# Patient Record
Sex: Male | Born: 1985 | Race: White | Hispanic: No | Marital: Single | Smoking: Current every day smoker
Health system: Southern US, Community
[De-identification: ages and names within clinical notes are randomized; demographics above are authoritative.]

## PROBLEM LIST (undated history)

## (undated) DIAGNOSIS — F419 Anxiety disorder, unspecified: Secondary | ICD-10-CM

## (undated) DIAGNOSIS — N433 Hydrocele, unspecified: Secondary | ICD-10-CM

## (undated) DIAGNOSIS — Z87828 Personal history of other (healed) physical injury and trauma: Secondary | ICD-10-CM

## (undated) DIAGNOSIS — F909 Attention-deficit hyperactivity disorder, unspecified type: Secondary | ICD-10-CM

## (undated) HISTORY — PX: OTHER SURGICAL HISTORY: SHX169

---
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
## 2008-08-13 ENCOUNTER — Inpatient Hospital Stay (HOSPITAL_COMMUNITY): Admission: EM | Admit: 2008-08-13 | Discharge: 2008-08-14 | Payer: Self-pay | Admitting: Emergency Medicine

## 2008-08-13 HISTORY — PX: OTHER SURGICAL HISTORY: SHX169

## 2009-08-17 ENCOUNTER — Emergency Department (HOSPITAL_COMMUNITY): Admission: EM | Admit: 2009-08-17 | Discharge: 2009-08-17 | Payer: Self-pay | Admitting: Emergency Medicine

## 2010-04-12 LAB — URINALYSIS, ROUTINE W REFLEX MICROSCOPIC
Bilirubin Urine: NEGATIVE
Ketones, ur: NEGATIVE mg/dL
Protein, ur: NEGATIVE mg/dL
Specific Gravity, Urine: 1.026 (ref 1.005–1.030)
pH: 7 (ref 5.0–8.0)

## 2010-04-12 LAB — BASIC METABOLIC PANEL
BUN: 9 mg/dL (ref 6–23)
CO2: 25 mEq/L (ref 19–32)
CO2: 25 mEq/L (ref 19–32)
Calcium: 9 mg/dL (ref 8.4–10.5)
Chloride: 106 mEq/L (ref 96–112)
GFR calc Af Amer: >60 mL/min (ref >60)
GFR calc non Af Amer: >60 mL/min (ref >60)
Potassium: 3.6 mEq/L (ref 3.5–5.1)
Sodium: 138 mEq/L (ref 135–145)

## 2010-04-12 LAB — CBC
HCT: 42 % (ref 39.0–52.0)
Hemoglobin: 13.9 g/dL (ref 13.0–17.0)
MCHC: 34.3 g/dL (ref 30.0–36.0)
MCV: 89.9 fL (ref 78.0–100.0)
MCV: 90.2 fL (ref 78.0–100.0)
Platelets: 226 10*3/uL (ref 150–400)
RBC: 4.49 MIL/uL (ref 4.22–5.81)
RDW: 12.6 % (ref 11.5–15.5)
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
## 2011-09-05 ENCOUNTER — Emergency Department (HOSPITAL_BASED_OUTPATIENT_CLINIC_OR_DEPARTMENT_OTHER)
Admission: EM | Admit: 2011-09-05 | Discharge: 2011-09-05 | Disposition: A | Discharge status: Home / Self Care | Payer: Self-pay | Attending: Emergency Medicine | Admitting: Emergency Medicine

## 2011-09-05 ENCOUNTER — Emergency Department (HOSPITAL_BASED_OUTPATIENT_CLINIC_OR_DEPARTMENT_OTHER): Payer: Self-pay

## 2011-09-05 ENCOUNTER — Encounter (HOSPITAL_BASED_OUTPATIENT_CLINIC_OR_DEPARTMENT_OTHER): Payer: Self-pay | Admitting: *Deleted

## 2011-09-05 DIAGNOSIS — Y9317 Activity, water skiing and wake boarding: Secondary | ICD-10-CM | POA: Insufficient documentation

## 2011-09-05 DIAGNOSIS — F17200 Nicotine dependence, unspecified, uncomplicated: Secondary | ICD-10-CM | POA: Insufficient documentation

## 2011-09-05 DIAGNOSIS — S0292XA Unspecified fracture of facial bones, initial encounter for closed fracture: Secondary | ICD-10-CM

## 2011-09-05 DIAGNOSIS — S02109A Fracture of base of skull, unspecified side, initial encounter for closed fracture: Secondary | ICD-10-CM | POA: Insufficient documentation

## 2011-09-05 DIAGNOSIS — Y998 Other external cause status: Secondary | ICD-10-CM | POA: Insufficient documentation

## 2011-09-05 DIAGNOSIS — IMO0002 Reserved for concepts with insufficient information to code with codable children: Secondary | ICD-10-CM | POA: Insufficient documentation

## 2011-09-05 MED ORDER — HYDROCODONE-ACETAMINOPHEN 5-325 MG PO TABS: 2.0000 | ORAL_TABLET | ORAL | Status: AC | PRN
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
## 2011-09-06 NOTE — ED Provider Notes (Signed)
Medical screening examination/treatment/procedure(s) were performed by non-physician practitioner and as supervising physician I was immediately available for consultation/collaboration.   Teaghan Formica, MD 09/06/11 0651 

## 2013-11-23 ENCOUNTER — Telehealth: Payer: Self-pay

## 2013-11-23 NOTE — Telephone Encounter (Signed)
[facility] with [facility] who indicated she received phone call from patient's mother indicating PCP appointment needed.  Per Scheduler, patient's mother concerned because patient told her one of his testicles is the "size of a pear."  Discussed with Dr. Hyman HopesJegede who indicated patient should go to the ED for an ultrasound.  Attempted to place call to patient's home number to discuss; however, patient's mother indicates he is unavailable. Patient also unavailable at his mobile number. Spoke with patient's mother, his emergency contact, to follow-up on her previous call to clinic. She indicated patient had a cyst in his testicle as a teenager, and he recently informed her that one of his testicles is much larger and is now the "size of a pear" and "painful sometimes."  Informed her he should go to the ED as soon as possible for evaluation of his testicle. Ultrasound may be needed.  Patient's mother (emergency contact) verbalized understanding and indicated she would get patient to the ED.  Also attempted once again to contact patient's cell [phone]) unable to reach patient. Left voicemail requesting return call.

## 2013-12-18 ENCOUNTER — Ambulatory Visit: Payer: Self-pay

## 2014-02-16 ENCOUNTER — Emergency Department (INDEPENDENT_AMBULATORY_CARE_PROVIDER_SITE_OTHER)
Admission: EM | Admit: 2014-02-16 | Discharge: 2014-02-16 | Disposition: A | Discharge status: Home / Self Care | Payer: Self-pay | Source: Home / Self Care | Attending: Family Medicine | Admitting: Family Medicine

## 2014-02-16 ENCOUNTER — Encounter (HOSPITAL_COMMUNITY): Payer: Self-pay | Admitting: Emergency Medicine

## 2014-02-16 DIAGNOSIS — N5089 Other specified disorders of the male genital organs: Secondary | ICD-10-CM

## 2014-02-16 DIAGNOSIS — N508 Other specified disorders of male genital organs: Secondary | ICD-10-CM

## 2014-02-16 NOTE — Discharge Instructions (Signed)
Scrotal Masses Scrotal swelling is common in men of all ages. Common types of testicular masses include:   Hydrocele. The most common benign testicular mass in an adult. Hydroceles are generally soft and painless collections of fluid in the scrotal sac. These can rapidly change size as the fluid enters or leaves. Hydroceles can be associated with an underlying cancer of the testicle.  Spermatoceles. Generally soft and painless cyst-like masses in the scrotum that contain fluid, usually above the testicle. They can rapidly change size as the fluid enters or leaves. They are more prominent while standing or exercising. Sometimes, spermatoceles may cause a sensation of heaviness or a dull ache.  Orchitis. Inflammation of the testicle. It is painful and may be associated with a fever or symptoms of a urinary tract infection, including frequent and painful urination. It is common in males who have the mumps.  Varicocele. An enlargement of the veins that drain the testicles. Varicoceles usually occur on the left side of the scrotum. This condition can increase the risk of infertility. Varicocele is sometimes more prominent while standing or exercising. Sometimes, varicoceles may cause a sensation of heaviness or a dull ache.  Inguinal hernia. A bulge caused by a portion of intestine protruding into the scrotum through a weak area in the abdominal muscles. Hernias may or may not be painful. They are soft and usually enlarge with coughing or straining.  Torsion of the testis. This can cause a testicular mass that develops quickly and is associated with tenderness or fever, or both. It is caused by a twisting of the testicle within the sac. It also reduces the blood supply and can destroy the testis if not treated quickly with surgery.  Epididymitis. Inflammation of the epididymis (a structure attached above and behind the testicle), usually caused by a urinary tract infection or a sexually transmitted  infection. This generally shows up as testicular discomfort and swelling and may include pain during urination. It is frequently associated with a testicle infection.  Testicular appendages. Remnants of tissue on the testis present since birth. A testicular appendage can twist on its blood supply and cause pain. In most cases, this is seen as a blue dot on the scrotum.  Hematocele. A collection of blood between the layers of the sac inside the scrotum. It usually is caused by trauma to the scrotum.  Sebaceous cysts. These can be a swelling in the skin of the scrotum and are usually painless.  Cancer (carcinoma) of the skin of the scrotum. It can cause scrotal swelling, but this is rare. Document Released: 06/27/2002 Document Revised: 08/23/2012 Document Reviewed: 06/12/2012 [facility] Patient Information 2015 [employer], [place]. This information is not intended to replace advice given to you by your health care provider. Make sure you discuss any questions you have with your health care provider.  Scrotal Swelling Scrotal swelling may occur on one or both sides of the scrotum. Pain may also occur with swelling. Possible causes of scrotal swelling include:   Injury.  Infection.  An ingrown hair or abrasion in the area.  Repeated rubbing from tight-fitting underwear.  Poor hygiene.  A weakened area in the muscles around the groin (hernia). A hernia can allow abdominal contents to push into the scrotum.  Fluid around the testicle (hydrocele).  Enlarged vein around the testicle (varicocele).  Certain medical treatments or existing conditions.  A recent genital surgery or procedure.  The spermatic cord becomes twisted in the scrotum, which cuts off blood supply (testicular torsion).  Testicular  cancer. HOME CARE INSTRUCTIONS Once the cause of your scrotal swelling has been determined, you may be asked to monitor your scrotum for any changes. The following actions may help to alleviate  any discomfort you are experiencing:  Rest and limit activity until the swelling goes away. Lying down is the preferred position.  Put ice on the scrotum:  Put ice in a plastic bag.  Place a towel between your skin and the bag.  Leave the ice on for 20 minutes, 2-3 times a day for 1-2 days.  Place a rolled towel under the testicles for support.  Wear loose-fitting clothing or an athletic support cup for comfort.  Take all medicines as directed by your health care provider.  Perform a monthly self-exam of the scrotum and penis. Feel for changes. Ask your health care provider how to perform a monthly self-exam if you are unsure. SEEK MEDICAL CARE IF:  You have a sudden (acute) onset of pain that is persistent and not improving.  You notice a heavy feeling or fluid in the scrotum.  You have pain or burning while urinating.  You have blood in the urine or semen.  You feel a lump around the testicle.  You notice that one testicle is larger than the other (slight variation is normal).  You have a persistent dull ache or pain in the groin or scrotum. SEEK IMMEDIATE MEDICAL CARE IF:  The pain does not go away or becomes severe.  You have a fever or shaking chills.  You have pain or vomiting that cannot be controlled.  You notice significant redness or swelling of one or both sides of the scrotum.  You experience redness spreading upward from your scrotum to your abdomen or downward from your scrotum to your thighs. MAKE SURE YOU:  Understand these instructions.  Will watch your condition.  Will get help right away if you are not doing well or get worse. Document Released: 01/23/2010 Document Revised: 08/23/2012 Document Reviewed: 05/25/2012 [facility] Patient Information 2015 [place], [place]. This information is not intended to replace advice given to you by your health care provider. Make sure you discuss any questions you have with your health care provider.

## 2014-02-16 NOTE — ED Notes (Signed)
C/o right testicle swelling.  States a problem for a while but has gradually gotten bigger.  Denies pain or injury.

## 2014-02-16 NOTE — ED Provider Notes (Signed)
CSN: [id number]     Arrival date & time 02/16/14  1502 History   First MD Initiated Contact with Patient 02/16/14 1546     Chief Complaint  Patient presents with  . Groin Swelling   (Consider location/radiation/quality/duration/timing/severity/associated sxs/prior Treatment) HPI Comments: 29 year old male has had an enlarged right scrotum for 10 years state that it has been increasing in size in particular over the past 5 years. He has missed 3 appointments recently for evaluation stating that he was too scared to go to those appointments. He states that there is very little pain associated with this mass but does interfere with sports activities. There is been no change today or in the past month. No increased pain, discoloration or other abnormalities. Urine function is nl.   History reviewed. No pertinent past medical history. Past Surgical History  Procedure Laterality Date  . Hand surgery     History reviewed. No pertinent family history. History  Substance Use Topics  . Smoking status: Current Every Day Smoker  . Smokeless tobacco: Not on file  . Alcohol Use: No    Review of Systems  Constitutional: Positive for activity change. Negative for fever.  Genitourinary: Positive for scrotal swelling. Negative for urgency, discharge, penile swelling, difficulty urinating, penile pain and testicular pain.    Allergies  Review of patient's allergies indicates no known allergies.  Home Medications   Prior to Admission medications   Medication Sig Start Date End Date Taking? Authorizing Provider  ibuprofen (ADVIL,MOTRIN) 200 MG tablet Take 200 mg by mouth every 6 (six) hours as needed. For headache.    Historical Provider, MD  Pseudoephedrine-DM-GG (ROBITUSSIN COLD & COUGH PO) Take 10 mLs by mouth daily as needed.    Historical Provider, MD   BP 113/72 mmHg  Pulse 87  Temp(Src) 98.2 F (36.8 C) (Oral)  Resp 16  SpO2 98% Physical Exam  Constitutional: He is oriented to person,  place, and time. He appears well-developed and well-nourished. No distress.  Pulmonary/Chest: Effort normal. No respiratory distress.  Genitourinary:  Normal phallus. The right scrotum is severely swollen. The terminal/dependent area has a large spherical mass approximately the size of a softball. When manipulated it is only mildly tender. There is minor hyperemia to the dependent areas. No cellulitis is appreciated. Unable to palpate the right testicle due to the swelling. Left testicle is of normal size and configuration. There are no external lesions observed.  Neurological: He is alert and oriented to person, place, and time.  Skin: Skin is warm.  Psychiatric: He has a normal mood and affect.  Nursing note and vitals reviewed.   ED Course  Procedures (including critical care time) Labs Review Labs Reviewed - No data to display  Imaging Review No results found.   MDM   1. Scrotal mass    Call urologist Monday for appt.    Hayden Rasmussenavid Telitha Plath, NP 02/16/14 [phone]

## 2014-02-28 ENCOUNTER — Other Ambulatory Visit: Payer: Self-pay | Admitting: Urology

## 2014-03-01 ENCOUNTER — Encounter (HOSPITAL_BASED_OUTPATIENT_CLINIC_OR_DEPARTMENT_OTHER): Payer: Self-pay | Admitting: *Deleted

## 2014-03-04 NOTE — Progress Notes (Signed)
UNABLE TO REACH PT.  LM ON HIS CELL TO ARRIVE AT 0830. NEEDS HX DONE.  DID SPEAK TO MOTHER LAST WEEK , SINCE SHE STATES SHE WAS BRINGING PT DOS, GAVE HER ARRIVAL TIME AT 0830 AND LOCATION.

## 2014-03-05 ENCOUNTER — Ambulatory Visit (HOSPITAL_BASED_OUTPATIENT_CLINIC_OR_DEPARTMENT_OTHER): Payer: Self-pay | Admitting: Anesthesiology

## 2014-03-05 ENCOUNTER — Encounter (HOSPITAL_BASED_OUTPATIENT_CLINIC_OR_DEPARTMENT_OTHER): Payer: Self-pay | Admitting: *Deleted

## 2014-03-05 ENCOUNTER — Encounter (HOSPITAL_BASED_OUTPATIENT_CLINIC_OR_DEPARTMENT_OTHER)
Admission: RE | Disposition: A | Discharge status: Home / Self Care | Payer: Self-pay | Source: Ambulatory Visit | Attending: Urology

## 2014-03-05 ENCOUNTER — Ambulatory Visit (HOSPITAL_BASED_OUTPATIENT_CLINIC_OR_DEPARTMENT_OTHER)
Admission: RE | Admit: 2014-03-05 | Discharge: 2014-03-05 | Disposition: A | Discharge status: Home / Self Care | Payer: Self-pay | Source: Ambulatory Visit | Attending: Urology | Admitting: Urology

## 2014-03-05 DIAGNOSIS — F1210 Cannabis abuse, uncomplicated: Secondary | ICD-10-CM | POA: Insufficient documentation

## 2014-03-05 DIAGNOSIS — Z6831 Body mass index (BMI) 31.0-31.9, adult: Secondary | ICD-10-CM | POA: Insufficient documentation

## 2014-03-05 DIAGNOSIS — F17210 Nicotine dependence, cigarettes, uncomplicated: Secondary | ICD-10-CM | POA: Insufficient documentation

## 2014-03-05 DIAGNOSIS — N433 Hydrocele, unspecified: Secondary | ICD-10-CM | POA: Insufficient documentation

## 2014-03-05 DIAGNOSIS — E669 Obesity, unspecified: Secondary | ICD-10-CM | POA: Insufficient documentation

## 2014-03-05 DIAGNOSIS — F1510 Other stimulant abuse, uncomplicated: Secondary | ICD-10-CM | POA: Insufficient documentation

## 2014-03-05 HISTORY — DX: Attention-deficit hyperactivity disorder, unspecified type: F90.9

## 2014-03-05 HISTORY — DX: Personal history of other (healed) physical injury and trauma: Z87.828

## 2014-03-05 HISTORY — DX: Hydrocele, unspecified: N43.3

## 2014-03-05 HISTORY — PX: HYDROCELE EXCISION: SHX482

## 2014-03-05 HISTORY — DX: Anxiety disorder, unspecified: F41.9

## 2014-03-05 LAB — POCT HEMOGLOBIN-HEMACUE: HEMOGLOBIN: 14.6 g/dL (ref 13.0–17.0)

## 2014-03-05 SURGERY — HYDROCELECTOMY
Anesthesia: General | Site: Scrotum | Laterality: Right

## 2014-03-05 MED ORDER — HYDROCODONE-ACETAMINOPHEN 5-325 MG PO TABS
ORAL_TABLET | ORAL | Status: AC
  Filled 2014-03-05: qty 1

## 2014-03-05 MED ORDER — FENTANYL CITRATE 0.05 MG/ML IJ SOLN
25.0000 ug | INTRAMUSCULAR | Status: DC | PRN
  Administered 2014-03-05 (×3): 50 ug via INTRAVENOUS
  Filled 2014-03-05: qty 1

## 2014-03-05 MED ORDER — FENTANYL CITRATE 0.05 MG/ML IJ SOLN
INTRAMUSCULAR | Status: AC
  Filled 2014-03-05: qty 2

## 2014-03-05 MED ORDER — CEFAZOLIN SODIUM-DEXTROSE 2-3 GM-% IV SOLR
INTRAVENOUS | Status: AC
  Filled 2014-03-05: qty 50

## 2014-03-05 MED ORDER — HYDROCODONE-ACETAMINOPHEN 5-325 MG PO TABS
1.0000 | ORAL_TABLET | Freq: Once | ORAL | Status: AC
  Administered 2014-03-05: 1 via ORAL
  Filled 2014-03-05: qty 1

## 2014-03-05 MED ORDER — PROPOFOL INFUSION 10 MG/ML OPTIME
INTRAVENOUS | Status: DC | PRN
  Administered 2014-03-05: 250 mL via INTRAVENOUS

## 2014-03-05 MED ORDER — CEFAZOLIN SODIUM-DEXTROSE 2-3 GM-% IV SOLR
INTRAVENOUS | Status: DC | PRN
  Administered 2014-03-05: 2 g via INTRAVENOUS

## 2014-03-05 MED ORDER — BUPIVACAINE HCL (PF) 0.25 % IJ SOLN
INTRAMUSCULAR | Status: DC | PRN
  Administered 2014-03-05: 5 mL

## 2014-03-05 MED ORDER — HYDROCODONE-ACETAMINOPHEN 5-325 MG PO TABS: 1.0000 | ORAL_TABLET | Freq: Four times a day (QID) | ORAL | Status: DC | PRN

## 2014-03-05 MED ORDER — CEFAZOLIN SODIUM 1-5 GM-% IV SOLN
1.0000 g | INTRAVENOUS | Status: DC
  Filled 2014-03-05: qty 50

## 2014-03-05 MED ORDER — LACTATED RINGERS IV SOLN
INTRAVENOUS | Status: DC
  Administered 2014-03-05: 09:00:00 via INTRAVENOUS
  Filled 2014-03-05: qty 1000

## 2014-03-05 MED ORDER — DEXAMETHASONE SODIUM PHOSPHATE 10 MG/ML IJ SOLN
INTRAMUSCULAR | Status: DC | PRN
  Administered 2014-03-05: 10 mg via INTRAVENOUS

## 2014-03-05 MED ORDER — MIDAZOLAM HCL 5 MG/5ML IJ SOLN
INTRAMUSCULAR | Status: DC | PRN
  Administered 2014-03-05: 2 mg via INTRAVENOUS

## 2014-03-05 MED ORDER — MIDAZOLAM HCL 2 MG/2ML IJ SOLN
INTRAMUSCULAR | Status: AC
  Filled 2014-03-05: qty 2

## 2014-03-05 MED ORDER — LIDOCAINE HCL (CARDIAC) 20 MG/ML IV SOLN
INTRAVENOUS | Status: DC | PRN
  Administered 2014-03-05: 100 mg via INTRAVENOUS

## 2014-03-05 MED ORDER — FENTANYL CITRATE 0.05 MG/ML IJ SOLN
INTRAMUSCULAR | Status: DC | PRN
  Administered 2014-03-05: 100 ug via INTRAVENOUS
  Administered 2014-03-05 (×3): 50 ug via INTRAVENOUS

## 2014-03-05 MED ORDER — CEFAZOLIN SODIUM-DEXTROSE 2-3 GM-% IV SOLR
2.0000 g | INTRAVENOUS | Status: DC
  Filled 2014-03-05: qty 50

## 2014-03-05 MED ORDER — ONDANSETRON HCL 4 MG/2ML IJ SOLN
INTRAMUSCULAR | Status: DC | PRN
  Administered 2014-03-05: 4 mg via INTRAVENOUS

## 2014-03-05 MED ORDER — PROMETHAZINE HCL 25 MG/ML IJ SOLN
6.2500 mg | INTRAMUSCULAR | Status: DC | PRN
Start: 2014-03-05 — End: 2014-03-05
  Filled 2014-03-05: qty 1

## 2014-03-05 SURGICAL SUPPLY — 52 items
ADH SKN CLS APL DERMABOND .7 (GAUZE/BANDAGES/DRESSINGS) ×1
APL SKNCLS STERI-STRIP NONHPOA (GAUZE/BANDAGES/DRESSINGS)
APPLICATOR COTTON TIP 6IN STRL (MISCELLANEOUS) ×1 IMPLANT
BENZOIN TINCTURE PRP APPL 2/3 (GAUZE/BANDAGES/DRESSINGS) IMPLANT
BLADE CLIPPER SURG (BLADE) ×3 IMPLANT
BLADE SURG 15 STRL LF DISP TIS (BLADE) ×1 IMPLANT
BLADE SURG 15 STRL SS (BLADE) ×3
BNDG GAUZE ELAST 4 BULKY (GAUZE/BANDAGES/DRESSINGS) ×3 IMPLANT
CANISTER SUCTION 1200CC (MISCELLANEOUS) IMPLANT
CANISTER SUCTION 2500CC (MISCELLANEOUS) ×2 IMPLANT
CLOSURE WOUND 1/2 X4 (GAUZE/BANDAGES/DRESSINGS)
CLOTH BEACON ORANGE TIMEOUT ST (SAFETY) ×1 IMPLANT
COVER MAYO STAND STRL (DRAPES) ×3 IMPLANT
COVER TABLE BACK 60X90 (DRAPES) ×3 IMPLANT
DERMABOND ADVANCED (GAUZE/BANDAGES/DRESSINGS) ×2
DERMABOND ADVANCED .7 DNX12 (GAUZE/BANDAGES/DRESSINGS) IMPLANT
DRAPE PED LAPAROTOMY (DRAPES) ×3 IMPLANT
ELECT NDL TIP 2.8 STRL (NEEDLE) ×1 IMPLANT
ELECT NEEDLE TIP 2.8 STRL (NEEDLE) IMPLANT
ELECT REM PT RETURN 9FT ADLT (ELECTROSURGICAL) ×3
ELECTRODE REM PT RTRN 9FT ADLT (ELECTROSURGICAL) ×1 IMPLANT
GLOVE BIO SURGEON STRL SZ7 (GLOVE) ×3 IMPLANT
GLOVE BIOGEL PI IND STRL 7.0 (GLOVE) IMPLANT
GLOVE BIOGEL PI IND STRL 7.5 (GLOVE) IMPLANT
GLOVE BIOGEL PI INDICATOR 7.0 (GLOVE) ×2
GLOVE BIOGEL PI INDICATOR 7.5 (GLOVE) ×4
GLOVE SKINSENSE NS SZ7.0 (GLOVE) ×2
GLOVE SKINSENSE STRL SZ7.0 (GLOVE) IMPLANT
GLOVE SURG SS PI 7.5 STRL IVOR (GLOVE) ×2 IMPLANT
GOWN STRL REUS W/TWL LRG LVL3 (GOWN DISPOSABLE) ×2 IMPLANT
GOWN STRL REUS W/TWL XL LVL3 (GOWN DISPOSABLE) ×2 IMPLANT
GOWN W/2 COTTON TOWELS 2 STD (GOWNS) ×1 IMPLANT
MARKER SKIN DUAL TIP RULER LAB (MISCELLANEOUS) ×3 IMPLANT
NDL HYPO 25X1 1.5 SAFETY (NEEDLE) IMPLANT
NEEDLE HYPO 25X1 1.5 SAFETY (NEEDLE) ×3 IMPLANT
NS IRRIG 500ML POUR BTL (IV SOLUTION) ×7 IMPLANT
PACK BASIN DAY SURGERY FS (CUSTOM PROCEDURE TRAY) ×3 IMPLANT
PAD PREP 24X48 CUFFED NSTRL (MISCELLANEOUS) ×3 IMPLANT
PENCIL BUTTON HOLSTER BLD 10FT (ELECTRODE) ×3 IMPLANT
STRIP CLOSURE SKIN 1/2X4 (GAUZE/BANDAGES/DRESSINGS) IMPLANT
SUPPORT SCROTAL LG STRP (MISCELLANEOUS) ×2 IMPLANT
SUPPORTER ATHLETIC LG (MISCELLANEOUS) ×1
SUT CHROMIC 3 0 PS 2 (SUTURE) ×5 IMPLANT
SUT CHROMIC 3 0 SH 27 (SUTURE) ×8 IMPLANT
SUT MON AB 4-0 PC3 18 (SUTURE) IMPLANT
SUT SILK 2 0 SH (SUTURE) IMPLANT
SUT VIC AB 3-0 SH 27 (SUTURE)
SUT VIC AB 3-0 SH 27X BRD (SUTURE) IMPLANT
SUT VICRYL 0 TIES 12 18 (SUTURE) IMPLANT
SYR CONTROL 10ML LL (SYRINGE) ×2 IMPLANT
WATER STERILE IRR 500ML POUR (IV SOLUTION) ×1 IMPLANT
YANKAUER SUCT BULB TIP NO VENT (SUCTIONS) ×3 IMPLANT

## 2014-03-05 NOTE — Anesthesia Postprocedure Evaluation (Signed)
  Anesthesia Post-op Note  Patient: Jay Moreno  Procedure(s) Performed: Procedure(s) (LRB): RIGHT HYDROCELECTOMY ADULT (Right)  Patient Location: PACU  Anesthesia Type: General  Level of Consciousness: awake and alert   Airway and Oxygen Therapy: Patient Spontanous Breathing  Post-op Pain: mild  Post-op Assessment: Post-op Vital signs reviewed, Patient's Cardiovascular Status Stable, Respiratory Function Stable, Patent Airway and No signs of Nausea or vomiting  Last Vitals:  Filed Vitals:   03/05/14 1230  BP: 137/85  Pulse: 65  Temp:   Resp: 18    Post-op Vital Signs: stable   Complications: No apparent anesthesia complications

## 2014-03-05 NOTE — Anesthesia Procedure Notes (Signed)
Procedure Name: LMA Insertion Date/Time: 03/05/2014 10:20 AM Performed by: Tyrone NineSAUVE, Sharia Averitt F Pre-anesthesia Checklist: Patient identified, Timeout performed, Emergency Drugs available, Suction available and Patient being monitored Patient Re-evaluated:Patient Re-evaluated prior to inductionOxygen Delivery Method: Circle system utilized Preoxygenation: Pre-oxygenation with 100% oxygen Intubation Type: IV induction Ventilation: Mask ventilation without difficulty LMA: LMA inserted LMA Size: 5.0 Number of attempts: 1 Airway Equipment and Method: Bite block Placement Confirmation: breath sounds checked- equal and bilateral and positive ETCO2 Tube secured with: Tape Dental Injury: Teeth and Oropharynx as per pre-operative assessment

## 2014-03-05 NOTE — Progress Notes (Signed)
CALLED AND SPOKE DR DENENNY MDA, ASKING IF DRUG SCREEN NEEDED SINCE PT DOES NOT REMEMBER WHEN LAST TIME HE HAD METH OR MARIJUANA.  DR DENENNY ASKED IF PT BEHAVIOR WAS APPROPRIATE AND IF VITALS SIGNS NORMAL.  I AND SUSAN SIGMOND RN STATED TO HIM THAT YES HE HAS APPROPRIATE BEHAVIOR AND AFFECT AND VITALS SIGNS NORMAL.  DR DENENNY STATES NO DRUG SCREEN NEEDED.

## 2014-03-05 NOTE — Anesthesia Preprocedure Evaluation (Addendum)
Anesthesia Evaluation  Patient identified by MRN, date of birth, ID band Patient awake    Reviewed: Allergy & Precautions, NPO status , Patient's Chart, lab work & pertinent test results  Airway Mallampati: I  TM Distance: >3 FB Neck ROM: Full    Dental no notable dental hx. 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,   Endo/Other  negative endocrine ROS  Renal/GU negative Renal ROS  negative genitourinary   Musculoskeletal negative musculoskeletal ROS (+)   Abdominal Normal abdominal exam  (+) + obese,   Peds negative pediatric ROS (+)  Hematology negative hematology ROS (+)   Anesthesia Other Findings   Reproductive/Obstetrics negative OB ROS                          Anesthesia Physical Anesthesia Plan  ASA: III  Anesthesia Plan: General   Post-op Pain Management:    Induction: Intravenous  Airway Management Planned: LMA  Additional Equipment:   Intra-op Plan:   Post-operative Plan: Extubation in OR  Informed Consent: I have reviewed the patients History and Physical, chart, labs and discussed the procedure including the risks, benefits and alternatives for the proposed anesthesia with the patient or authorized representative who has indicated his/her understanding and acceptance.   Dental advisory given  Plan Discussed with: CRNA and Anesthesiologist  Anesthesia Plan Comments:        Anesthesia Quick Evaluation

## 2014-03-05 NOTE — Op Note (Signed)
[name] C Erdman is a 29 y.o.   03/05/2014  General  Preop diagnosis: Large right hydrocele  Postop diagnosis: Same  Procedure done: Right hydrocelectomy  Surgeon: Wendie SimmerMarc H. Celina Shiley  Anesthesia: Gen.  Indication: Patient is a 29 years old male who has been complaining of increasing swelling of his right scrotum. He was diagnosed about 9 years ago with a right hydrocele. He elected at that time not to have a surgical excision. The hydrocele has been increasing in size. It is not associated with any pain but it interferes with sports activities. Scrotal ultrasound shows a large right hydrocele that measures 12 x 11 x 11 cm. And he wishes to have excision of the hydrocele. The risks, benefits of the procedure were discussed with him and his mother. The risks include but are not limited to hemorrhage, infection, hematoma, recurrence. They understand and are agreeable.  Procedure: Patient was identified by his wrist band and proper timeout was taken.  Under general anesthesia he was prepped and draped and placed in the supine position. The scrotum was infiltrated with 0.25% Marcaine. Then a longitudinal incision was made on the right scrotum. The incision was carried down to the tunica vaginalis. Hemostasis was secured with electrocautery. Then the tunica vaginalis was incised. About 1200 mL of clear fluid were drained out of the hydrocele sac. Excision of the appendix testis was done. Appendix epididymis was not present. The scrotum was irrigated with normal saline. Then using the Lord's technique the tunica vaginalis was plicated with #3-0 chromic. The scrotum was then irrigated again with normal saline. There was no evidence of bleeding. The scrotum was then closed in 2 layers with #3-0 chromic using running sutures. Sterile dressing was then applied to the wound.  The patient tolerated the procedure well and left the OR in satisfactory condition to postanesthesia care unit.  EBL: Minimal    Needles, sponges count: Correct

## 2014-03-05 NOTE — Discharge Instructions (Signed)
Ice packs to scrotum No heavy lifting or straining     HOME CARE INSTRUCTIONS FOR SCROTAL PROCEDURES  Wound Care & Hygiene: You may apply an ice bag to the scrotum for the first 24 hours.  This may help decrease swelling and soreness.  You may have a dressing held in place by an athletic supporter.  You may remove the dressing in 24 hours and shower in 48 hours.  Continue to use the athletic supporter or tight briefs for at least a week. Activity: Rest today - not necessarily flat bed rest.  Just take it easy.  You should not do strenuous activities until your follow-up visit with your doctor.  You may resume light activity in 48 hours.  Return to Work:  Your doctor will advise you of this depending on the type of work you do  Diet: Drink liquids or eat a light diet this evening.  You may resume a regular diet tomorrow.  General Expectations: You may have a small amount of bleeding.  The scrotum may be swollen or bruised for about a week.  Call your Doctor if these occur:  -persistent or heavy bleeding  -temperature of 101 degrees or more  -severe pain, not relieved by your pain medication    Post Anesthesia Home Care Instructions  Activity: Get plenty of rest for the remainder of the day. A responsible adult should stay with you for 24 hours following the procedure.  For the next 24 hours, DO NOT: -Drive a car -Advertising copywriterperate machinery -Drink alcoholic beverages -Take any medication unless instructed by your physician -Make any legal decisions or sign important papers.  Meals: Start with liquid foods such as gelatin or soup. Progress to regular foods as tolerated. Avoid greasy, spicy, heavy foods. If nausea and/or vomiting occur, drink only clear liquids until the nausea and/or vomiting subsides. Call your physician if vomiting continues.  Special Instructions/Symptoms: Your throat may feel dry or sore from the anesthesia or the breathing tube placed in your throat during  surgery. If this causes discomfort, gargle with warm salt water. The discomfort should disappear within 24 hours.

## 2014-03-05 NOTE — Transfer of Care (Signed)
Immediate Anesthesia Transfer of Care Note  Patient: Jay Moreno  Procedure(s) Performed: Procedure(s): RIGHT HYDROCELECTOMY ADULT (Right)  Patient Location: PACU  Anesthesia Type:General  Level of Consciousness: awake, alert , oriented and patient cooperative  Airway & Oxygen Therapy: Patient Spontanous Breathing and Patient connected to nasal cannula oxygen  Post-op Assessment: Report given to RN and Post -op Vital signs reviewed and stable  Post vital signs: Reviewed and stable  Last Vitals:  Filed Vitals:   03/05/14 0826  BP: 133/61  Pulse: 56  Temp: 36.4 C  Resp: 16    Complications: No apparent anesthesia complications

## 2014-03-05 NOTE — H&P (Signed)
History of Present Illness Jay Moreno was seen at the [name] a week ago for increasing swelling of the right scrotum. He was diagnosed with a right hydrocele 9 years ago. It has been gradually increasing in size. It is not associated with any pain. It interferes with sports activities. He voids well.   Past Medical History Problems  1. History of No acute medical problems  Surgical History Problems  1. History of Hand Surgery  Current Meds 1. No Reported Medications Recorded  Allergies Medication  1. No Known Drug Allergies  Family History Problems  1. Family history of myocardial infarction (Z82.49) : Father  Social History Problems    Denied: History of Alcohol use   Denied: History of Cigarette smoker   Father deceased   7858 heart attack   Former smoker (Z87.891)   Mother alive and healthy   5162   Single   Unemployed (Z56.0)  Review of Systems Genitourinary, constitutional, skin, eye, otolaryngeal, hematologic/lymphatic, cardiovascular, pulmonary, endocrine, musculoskeletal, gastrointestinal, neurological and psychiatric system(s) were reviewed and pertinent findings if present are noted and are otherwise negative.  Genitourinary: scrotal swelling.  Psychiatric: anxiety and depression.    Vitals Vital Signs [Data Includes: Last 1 Day]  Recorded: 19Feb2016 11:10AM  Height: 6 ft  Weight: 222 lb  BMI Calculated: 30.11 BSA Calculated: 2.23 Blood Pressure: 127 / 71 Temperature: 98.3 F Heart Rate: 84 Respiration: 18  Physical Exam Constitutional: Well nourished and well developed . No acute distress.  ENT:. The ears and nose are normal in appearance.  Neck: The appearance of the neck is normal and no neck mass is present.  Pulmonary: No respiratory distress and normal respiratory rhythm and effort.  Cardiovascular: Heart rate and rhythm are normal . No peripheral edema.  Abdomen: The abdomen is soft and nontender. No masses are palpated.  No CVA tenderness. No hernias are palpable. No hepatosplenomegaly noted.  Rectal: Rectal exam demonstrates normal sphincter tone, no tenderness and no masses. The prostate has no nodularity and is not tender. The left seminal vesicle is nonpalpable. The right seminal vesicle is nonpalpable. The perineum is normal on inspection.  Genitourinary: Examination of the penis demonstrates no discharge, no masses, no lesions and a normal meatus. The penis is circumcised. The scrotum is without lesions. Examination of the right scrotum demonstrates a hydrocele. The right scrotum is markedly enlarged. It is non tender. I can not palpate the right testis. Transillumination is positive. The right epididymis is palpably normal and non-tender. The left epididymis is palpably normal and non-tender. The right testis is nonpalpable, non-tender and without masses. The left testis is normal, non-tender and without masses.  Lymphatics: The femoral and inguinal nodes are not enlarged or tender.  Skin: Normal skin turgor, no visible rash and no visible skin lesions.  Neuro/Psych:. Mood and affect are appropriate.    Results/Data  SCROTAL ULTRASOUND  INDICATION: Swelling right scrotum   The right testis measures 4.7 x 3.0 x 3.1 cm. There is no testicular mass. There is no evidence of hernia. There is a large hydrocele that measures 12 x 11 x 11 cm.  The left testis measures 5.6 x 3.5 x 2.0 cm. There are several left epididymal cysts.  IMPRESSION: Large right hydrocele. Left epididymal cyst. No testicular mass.    22 Feb 2014 10:56 AM    UA With REFLEX      COLOR YELLOW      APPEARANCE CLEAR      SPECIFIC GRAVITY 1.025  pH 5.5      GLUCOSE NEG      BILIRUBIN NEG      KETONE NEG      BLOOD NEG      PROTEIN NEG      UROBILINOGEN 0.2      NITRITE NEG      LEUKOCYTE ESTERASE NEG     Assessment Assessed  1. Hydrocele, right (N43.3)  Plan Hydrocele, right  1. SCROTAL U/S; Status:Resulted  - Requires Verification;   Done: 19Feb2016 12:00AM  He needs right hydrocelectomy. The procedure, risks, benefits were discussed with the patient and his mother. The risks include but are not limited to hemorrhage, hematoma, infection, recurrence. They understand and wish to proceed.

## 2014-03-06 ENCOUNTER — Encounter (HOSPITAL_BASED_OUTPATIENT_CLINIC_OR_DEPARTMENT_OTHER): Payer: Self-pay | Admitting: Urology

## 2015-01-04 ENCOUNTER — Emergency Department (HOSPITAL_COMMUNITY): Payer: Self-pay

## 2015-01-04 ENCOUNTER — Emergency Department (HOSPITAL_COMMUNITY): Payer: Self-pay | Admitting: Anesthesiology

## 2015-01-04 ENCOUNTER — Encounter (HOSPITAL_COMMUNITY): Admission: EM | Disposition: A | Discharge status: Home / Self Care | Payer: Self-pay | Source: Home / Self Care

## 2015-01-04 ENCOUNTER — Inpatient Hospital Stay (HOSPITAL_COMMUNITY)
Admission: EM | Admit: 2015-01-04 | Discharge: 2015-01-08 | DRG: 353 | Disposition: A | Discharge status: Home / Self Care | Payer: Self-pay | Attending: General Surgery | Admitting: General Surgery

## 2015-01-04 ENCOUNTER — Encounter (HOSPITAL_COMMUNITY): Payer: Self-pay | Admitting: *Deleted

## 2015-01-04 DIAGNOSIS — K567 Ileus, unspecified: Secondary | ICD-10-CM | POA: Diagnosis not present

## 2015-01-04 DIAGNOSIS — S35299A Unspecified injury of branches of celiac and mesenteric artery, initial encounter: Secondary | ICD-10-CM | POA: Diagnosis present

## 2015-01-04 DIAGNOSIS — S31639A Puncture wound without foreign body of abdominal wall, unspecified quadrant with penetration into peritoneal cavity, initial encounter: Principal | ICD-10-CM | POA: Diagnosis present

## 2015-01-04 DIAGNOSIS — D62 Acute posthemorrhagic anemia: Secondary | ICD-10-CM | POA: Diagnosis not present

## 2015-01-04 DIAGNOSIS — F909 Attention-deficit hyperactivity disorder, unspecified type: Secondary | ICD-10-CM | POA: Diagnosis present

## 2015-01-04 DIAGNOSIS — W458XXA Other foreign body or object entering through skin, initial encounter: Secondary | ICD-10-CM | POA: Diagnosis present

## 2015-01-04 DIAGNOSIS — F419 Anxiety disorder, unspecified: Secondary | ICD-10-CM | POA: Diagnosis present

## 2015-01-04 DIAGNOSIS — S31109A Unspecified open wound of abdominal wall, unspecified quadrant without penetration into peritoneal cavity, initial encounter: Secondary | ICD-10-CM

## 2015-01-04 DIAGNOSIS — S35291A Minor laceration of branches of celiac and mesenteric artery, initial encounter: Secondary | ICD-10-CM | POA: Diagnosis present

## 2015-01-04 DIAGNOSIS — F17210 Nicotine dependence, cigarettes, uncomplicated: Secondary | ICD-10-CM | POA: Diagnosis present

## 2015-01-04 DIAGNOSIS — S31119A Laceration without foreign body of abdominal wall, unspecified quadrant without penetration into peritoneal cavity, initial encounter: Secondary | ICD-10-CM | POA: Diagnosis present

## 2015-01-04 DIAGNOSIS — I959 Hypotension, unspecified: Secondary | ICD-10-CM | POA: Diagnosis not present

## 2015-01-04 DIAGNOSIS — K661 Hemoperitoneum: Secondary | ICD-10-CM | POA: Diagnosis present

## 2015-01-04 DIAGNOSIS — S39021A Laceration of muscle, fascia and tendon of abdomen, initial encounter: Secondary | ICD-10-CM | POA: Diagnosis present

## 2015-01-04 HISTORY — PX: LAPAROTOMY: SHX154

## 2015-01-04 LAB — CBC
HEMATOCRIT: 42.2 % (ref 39.0–52.0)
HEMATOCRIT: 30.2 % — AB (ref 39.0–52.0)
HEMOGLOBIN: 10.3 g/dL — AB (ref 13.0–17.0)
Hemoglobin: 14.5 g/dL (ref 13.0–17.0)
MCH: 29.7 pg (ref 26.0–34.0)
MCH: 29.7 pg (ref 26.0–34.0)
MCHC: 34.4 g/dL (ref 30.0–36.0)
MCHC: 34.1 g/dL (ref 30.0–36.0)
MCV: 86.5 fL (ref 78.0–100.0)
MCV: 87 fL (ref 78.0–100.0)
Platelets: 317 10*3/uL (ref 150–400)
Platelets: 239 10*3/uL (ref 150–400)
RBC: 4.88 MIL/uL (ref 4.22–5.81)
RBC: 3.47 MIL/uL — AB (ref 4.22–5.81)
RDW: 13 % (ref 11.5–15.5)
RDW: 13.3 % (ref 11.5–15.5)
WBC: 11.3 10*3/uL — AB (ref 4.0–10.5)
WBC: 19.8 10*3/uL — ABNORMAL HIGH (ref 4.0–10.5)

## 2015-01-04 LAB — PREPARE FRESH FROZEN PLASMA
UNIT DIVISION: 0
Unit division: 0

## 2015-01-04 LAB — COMPREHENSIVE METABOLIC PANEL
ALT: 101 U/L — AB (ref 17–63)
AST: 44 U/L — AB (ref 15–41)
Albumin: 4.7 g/dL (ref 3.5–5.0)
Alkaline Phosphatase: 78 U/L (ref 38–126)
Anion gap: 11 (ref 5–15)
BUN: 9 mg/dL (ref 6–20)
CALCIUM: 9.1 mg/dL (ref 8.9–10.3)
CO2: 27 mmol/L (ref 22–32)
CREATININE: 0.83 mg/dL (ref 0.61–1.24)
Chloride: 101 mmol/L (ref 101–111)
GFR calc non Af Amer: >60 mL/min (ref >60)
Glucose, Bld: 123 mg/dL — ABNORMAL HIGH (ref 65–99)
Potassium: 3.6 mmol/L (ref 3.5–5.1)
Sodium: 139 mmol/L (ref 135–145)
Total Bilirubin: 0.6 mg/dL (ref 0.3–1.2)
Total Protein: 7.8 g/dL (ref 6.5–8.1)

## 2015-01-04 LAB — POCT I-STAT 7, (LYTES, BLD GAS, ICA,H+H)
Acid-base deficit: 4 mmol/L — ABNORMAL HIGH (ref 0.0–2.0)
Bicarbonate: 22.1 mEq/L (ref 20.0–24.0)
Calcium, Ion: 1.13 mmol/L (ref 1.12–1.23)
HCT: 34 % — ABNORMAL LOW (ref 39.0–52.0)
HEMOGLOBIN: 11.6 g/dL — AB (ref 13.0–17.0)
O2 Saturation: 98 %
PCO2 ART: 40.2 mmHg (ref 35.0–45.0)
POTASSIUM: 4.1 mmol/L (ref 3.5–5.1)
Patient temperature: 36
Sodium: 139 mmol/L (ref 135–145)
TCO2: 23 mmol/L (ref 0–100)
pH, Arterial: 7.343 — ABNORMAL LOW (ref 7.350–7.450)
pO2, Arterial: 109 mmHg — ABNORMAL HIGH (ref 80.0–100.0)

## 2015-01-04 LAB — ETHANOL: Alcohol, Ethyl (B): <5 mg/dL (ref <5)

## 2015-01-04 LAB — PREPARE RBC (CROSSMATCH)

## 2015-01-04 LAB — PROTIME-INR
INR: 1.12 (ref 0.00–1.49)
Prothrombin Time: 14.6 seconds (ref 11.6–15.2)

## 2015-01-04 LAB — ABO/RH
ABO/RH(D): O POS
ABO/RH(D): O POS

## 2015-01-04 SURGERY — LAPAROTOMY, EXPLORATORY
Anesthesia: General | Site: Abdomen

## 2015-01-04 MED ORDER — PROPOFOL 10 MG/ML IV BOLUS
INTRAVENOUS | Status: DC | PRN
  Administered 2015-01-04: 80 mg via INTRAVENOUS

## 2015-01-04 MED ORDER — DIPHENHYDRAMINE HCL 12.5 MG/5ML PO ELIX: 12.5000 mg | ORAL_SOLUTION | Freq: Four times a day (QID) | ORAL | Status: DC | PRN

## 2015-01-04 MED ORDER — MORPHINE SULFATE 2 MG/ML IV SOLN
INTRAVENOUS | Status: DC
  Administered 2015-01-04: 16:00:00 via INTRAVENOUS
  Administered 2015-01-04: 16.5 mg via INTRAVENOUS
  Administered 2015-01-05: 13 mg via INTRAVENOUS
  Administered 2015-01-05: 19:00:00 via INTRAVENOUS
  Administered 2015-01-05: 2 mg via INTRAVENOUS
  Administered 2015-01-05: 19.5 mg via INTRAVENOUS
  Administered 2015-01-05: 19 mg via INTRAVENOUS
  Administered 2015-01-05: 24 mg via INTRAVENOUS
  Administered 2015-01-05: 11:00:00 via INTRAVENOUS
  Administered 2015-01-06: 28.5 mg via INTRAVENOUS
  Administered 2015-01-06: 24 mg via INTRAVENOUS
  Administered 2015-01-06: 22.5 mg via INTRAVENOUS
  Administered 2015-01-06: 2 mg via INTRAVENOUS
  Filled 2015-01-04 (×5): qty 25

## 2015-01-04 MED ORDER — 0.9 % SODIUM CHLORIDE (POUR BTL) OPTIME
TOPICAL | Status: DC | PRN
  Administered 2015-01-04 (×3): 1000 mL

## 2015-01-04 MED ORDER — PROMETHAZINE HCL 25 MG/ML IJ SOLN: 6.2500 mg | INTRAMUSCULAR | Status: DC | PRN

## 2015-01-04 MED ORDER — FENTANYL CITRATE (PF) 100 MCG/2ML IJ SOLN
INTRAMUSCULAR | Status: DC | PRN
  Administered 2015-01-04: 50 ug via INTRAVENOUS
  Administered 2015-01-04: 150 ug via INTRAVENOUS
  Administered 2015-01-04: 50 ug via INTRAVENOUS

## 2015-01-04 MED ORDER — GLYCOPYRROLATE 0.2 MG/ML IJ SOLN
INTRAMUSCULAR | Status: AC
  Filled 2015-01-04: qty 2

## 2015-01-04 MED ORDER — MORPHINE SULFATE 2 MG/ML IV SOLN
INTRAVENOUS | Status: AC
  Filled 2015-01-04: qty 25

## 2015-01-04 MED ORDER — LACTATED RINGERS IV SOLN: INTRAVENOUS | Status: DC | PRN

## 2015-01-04 MED ORDER — HYDROMORPHONE HCL 1 MG/ML IJ SOLN
INTRAMUSCULAR | Status: AC
  Administered 2015-01-04: 0.5 mg via INTRAVENOUS
  Filled 2015-01-04: qty 1

## 2015-01-04 MED ORDER — FENTANYL CITRATE (PF) 100 MCG/2ML IJ SOLN: 25.0000 ug | INTRAMUSCULAR | Status: DC | PRN

## 2015-01-04 MED ORDER — ONDANSETRON HCL 4 MG/2ML IJ SOLN: 4.0000 mg | Freq: Four times a day (QID) | INTRAMUSCULAR | Status: DC | PRN

## 2015-01-04 MED ORDER — NEOSTIGMINE METHYLSULFATE 10 MG/10ML IV SOLN
INTRAVENOUS | Status: DC | PRN
  Administered 2015-01-04: 2 mg via INTRAVENOUS

## 2015-01-04 MED ORDER — CEFAZOLIN SODIUM-DEXTROSE 2-3 GM-% IV SOLR
INTRAVENOUS | Status: DC | PRN
  Administered 2015-01-04: 2 g via INTRAVENOUS

## 2015-01-04 MED ORDER — SODIUM CHLORIDE 0.9 % IV BOLUS (SEPSIS)
125.0000 mL | Freq: Once | INTRAVENOUS | Status: AC
  Administered 2015-01-04: 125 mL via INTRAVENOUS

## 2015-01-04 MED ORDER — MORPHINE SULFATE (PF) 4 MG/ML IV SOLN
4.0000 mg | Freq: Once | INTRAVENOUS | Status: AC
  Administered 2015-01-04: 4 mg via INTRAVENOUS
  Filled 2015-01-04: qty 1

## 2015-01-04 MED ORDER — NEOSTIGMINE METHYLSULFATE 10 MG/10ML IV SOLN
INTRAVENOUS | Status: AC
  Filled 2015-01-04: qty 1

## 2015-01-04 MED ORDER — HYDROMORPHONE HCL 1 MG/ML IJ SOLN
INTRAMUSCULAR | Status: AC
  Administered 2015-01-04: 0.5 mg via INTRAVENOUS
  Filled 2015-01-04: qty 2

## 2015-01-04 MED ORDER — ONDANSETRON HCL 4 MG/2ML IJ SOLN
INTRAMUSCULAR | Status: AC
  Filled 2015-01-04: qty 2

## 2015-01-04 MED ORDER — ONDANSETRON HCL 4 MG/2ML IJ SOLN
INTRAMUSCULAR | Status: DC | PRN
  Administered 2015-01-04: 4 mg via INTRAVENOUS

## 2015-01-04 MED ORDER — ONDANSETRON HCL 4 MG PO TABS: 4.0000 mg | ORAL_TABLET | Freq: Four times a day (QID) | ORAL | Status: DC | PRN

## 2015-01-04 MED ORDER — SODIUM CHLORIDE 0.9 % IV SOLN
Freq: Once | INTRAVENOUS | Status: AC
  Administered 2015-01-04 (×2): via INTRAVENOUS

## 2015-01-04 MED ORDER — MIDAZOLAM HCL 5 MG/5ML IJ SOLN
INTRAMUSCULAR | Status: DC | PRN
  Administered 2015-01-04 (×2): 1 mg via INTRAVENOUS

## 2015-01-04 MED ORDER — NALOXONE HCL 0.4 MG/ML IJ SOLN: 0.4000 mg | INTRAMUSCULAR | Status: DC | PRN

## 2015-01-04 MED ORDER — ACETAMINOPHEN 325 MG PO TABS
650.0000 mg | ORAL_TABLET | ORAL | Status: DC | PRN
  Administered 2015-01-06 – 2015-01-08 (×2): 650 mg via ORAL
  Filled 2015-01-04 (×2): qty 2

## 2015-01-04 MED ORDER — MIDAZOLAM HCL 2 MG/2ML IJ SOLN
INTRAMUSCULAR | Status: AC
  Filled 2015-01-04: qty 2

## 2015-01-04 MED ORDER — SUCCINYLCHOLINE CHLORIDE 20 MG/ML IJ SOLN
INTRAMUSCULAR | Status: DC | PRN
  Administered 2015-01-04: 100 mg via INTRAVENOUS

## 2015-01-04 MED ORDER — OXYCODONE HCL 5 MG PO TABS: 10.0000 mg | ORAL_TABLET | Freq: Once | ORAL | Status: DC | PRN

## 2015-01-04 MED ORDER — ROCURONIUM BROMIDE 100 MG/10ML IV SOLN
INTRAVENOUS | Status: DC | PRN
  Administered 2015-01-04: 50 mg via INTRAVENOUS

## 2015-01-04 MED ORDER — PHENYLEPHRINE HCL 10 MG/ML IJ SOLN
INTRAMUSCULAR | Status: DC | PRN
  Administered 2015-01-04: 120 ug via INTRAVENOUS

## 2015-01-04 MED ORDER — LACTATED RINGERS IV SOLN
INTRAVENOUS | Status: DC | PRN
  Administered 2015-01-04: 14:00:00 via INTRAVENOUS

## 2015-01-04 MED ORDER — SODIUM CHLORIDE 0.9 % IV SOLN
INTRAVENOUS | Status: DC
  Administered 2015-01-04 – 2015-01-05 (×2): via INTRAVENOUS

## 2015-01-04 MED ORDER — DIPHENHYDRAMINE HCL 50 MG/ML IJ SOLN
12.5000 mg | Freq: Four times a day (QID) | INTRAMUSCULAR | Status: DC | PRN
  Administered 2015-01-04 – 2015-01-06 (×3): 12.5 mg via INTRAVENOUS
  Filled 2015-01-04 (×3): qty 1

## 2015-01-04 MED ORDER — HYDROMORPHONE HCL 1 MG/ML IJ SOLN
0.2500 mg | INTRAMUSCULAR | Status: DC | PRN
  Administered 2015-01-04 (×4): 0.5 mg via INTRAVENOUS

## 2015-01-04 MED ORDER — FENTANYL CITRATE (PF) 250 MCG/5ML IJ SOLN
INTRAMUSCULAR | Status: AC
  Filled 2015-01-04: qty 5

## 2015-01-04 MED ORDER — HYDROMORPHONE HCL 1 MG/ML IJ SOLN
0.2500 mg | INTRAMUSCULAR | Status: DC | PRN
  Administered 2015-01-04 (×2): 0.5 mg via INTRAVENOUS

## 2015-01-04 MED ORDER — OXYCODONE HCL 5 MG/5ML PO SOLN: 5.0000 mg | Freq: Once | ORAL | Status: DC | PRN

## 2015-01-04 MED ORDER — GLYCOPYRROLATE 0.2 MG/ML IJ SOLN
INTRAMUSCULAR | Status: DC | PRN
  Administered 2015-01-04: 0.4 mg via INTRAVENOUS

## 2015-01-04 MED ORDER — SODIUM CHLORIDE 0.9 % IJ SOLN: 9.0000 mL | INTRAMUSCULAR | Status: DC | PRN

## 2015-01-04 MED ORDER — IOHEXOL 300 MG/ML  SOLN
100.0000 mL | Freq: Once | INTRAMUSCULAR | Status: AC | PRN
  Administered 2015-01-04: 100 mL via INTRAVENOUS

## 2015-01-04 SURGICAL SUPPLY — 49 items
BLADE SURG ROTATE 9660 (MISCELLANEOUS) IMPLANT
CANISTER SUCTION 2500CC (MISCELLANEOUS) ×3 IMPLANT
CHLORAPREP W/TINT 26ML (MISCELLANEOUS) ×3 IMPLANT
COVER MAYO STAND STRL (DRAPES) IMPLANT
COVER SURGICAL LIGHT HANDLE (MISCELLANEOUS) ×3 IMPLANT
DRAPE LAPAROSCOPIC ABDOMINAL (DRAPES) ×3 IMPLANT
DRAPE PROXIMA HALF (DRAPES) IMPLANT
DRAPE UTILITY XL STRL (DRAPES) ×6 IMPLANT
DRAPE WARM FLUID 44X44 (DRAPE) ×3 IMPLANT
DRSG OPSITE POSTOP 4X10 (GAUZE/BANDAGES/DRESSINGS) ×2 IMPLANT
DRSG OPSITE POSTOP 4X8 (GAUZE/BANDAGES/DRESSINGS) IMPLANT
ELECT BLADE 6.5 EXT (BLADE) IMPLANT
ELECT CAUTERY BLADE 6.4 (BLADE) ×6 IMPLANT
ELECT REM PT RETURN 9FT ADLT (ELECTROSURGICAL) ×3
ELECTRODE REM PT RTRN 9FT ADLT (ELECTROSURGICAL) ×1 IMPLANT
GLOVE BIO SURGEON STRL SZ7 (GLOVE) ×5 IMPLANT
GLOVE BIOGEL PI IND STRL 7.0 (GLOVE) IMPLANT
GLOVE BIOGEL PI IND STRL 7.5 (GLOVE) ×1 IMPLANT
GLOVE BIOGEL PI INDICATOR 7.0 (GLOVE) ×4
GLOVE BIOGEL PI INDICATOR 7.5 (GLOVE) ×2
GOWN STRL REUS W/ TWL LRG LVL3 (GOWN DISPOSABLE) ×3 IMPLANT
GOWN STRL REUS W/TWL LRG LVL3 (GOWN DISPOSABLE) ×9
HEMOSTAT SNOW SURGICEL 2X4 (HEMOSTASIS) ×2 IMPLANT
KIT BASIN OR (CUSTOM PROCEDURE TRAY) ×3 IMPLANT
KIT ROOM TURNOVER OR (KITS) ×3 IMPLANT
LIGASURE IMPACT 36 18CM CVD LR (INSTRUMENTS) IMPLANT
NS IRRIG 1000ML POUR BTL (IV SOLUTION) ×6 IMPLANT
PACK GENERAL/GYN (CUSTOM PROCEDURE TRAY) ×3 IMPLANT
PAD ARMBOARD 7.5X6 YLW CONV (MISCELLANEOUS) ×3 IMPLANT
PENCIL BUTTON HOLSTER BLD 10FT (ELECTRODE) IMPLANT
SPECIMEN JAR LARGE (MISCELLANEOUS) IMPLANT
SPONGE LAP 18X18 X RAY DECT (DISPOSABLE) IMPLANT
STAPLER VISISTAT 35W (STAPLE) ×3 IMPLANT
SUCTION POOLE TIP (SUCTIONS) ×3 IMPLANT
SUT PDS AB 1 TP1 96 (SUTURE) ×6 IMPLANT
SUT SILK 2 0 (SUTURE) ×3
SUT SILK 2 0 SH CR/8 (SUTURE) ×3 IMPLANT
SUT SILK 2-0 18XBRD TIE 12 (SUTURE) ×1 IMPLANT
SUT SILK 3 0 (SUTURE) ×3
SUT SILK 3 0 SH CR/8 (SUTURE) ×3 IMPLANT
SUT SILK 3-0 18XBRD TIE 12 (SUTURE) ×1 IMPLANT
SUT VIC AB 2-0 SH 18 (SUTURE) ×2 IMPLANT
SUT VIC AB 3-0 SH 27 (SUTURE)
SUT VIC AB 3-0 SH 27X BRD (SUTURE) IMPLANT
TOWEL OR 17X26 10 PK STRL BLUE (TOWEL DISPOSABLE) ×3 IMPLANT
TRAY FOLEY CATH 16FRSI W/METER (SET/KITS/TRAYS/PACK) IMPLANT
TUBE CONNECTING 12'X1/4 (SUCTIONS)
TUBE CONNECTING 12X1/4 (SUCTIONS) IMPLANT
YANKAUER SUCT BULB TIP NO VENT (SUCTIONS) IMPLANT
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
## 2015-01-04 NOTE — Transfer of Care (Signed)
Immediate Anesthesia Transfer of Care Note  Patient: Jay Moreno  Procedure(s) Performed: Procedure(s): wound exploration, diagnostic laparotomy, possible laparotomy (N/A)  Patient Location: PACU  Anesthesia Type:General  Level of Consciousness: awake and alert   Airway & Oxygen Therapy: Patient Spontanous Breathing and Patient connected to nasal cannula oxygen  Post-op Assessment: Report given to RN and Post -op Vital signs reviewed and stable  Post vital signs: Reviewed and stable  Last Vitals:  Filed Vitals:   01/04/15 1325 01/04/15 1535  BP: 98/61   Pulse:    Temp:  36.3 C  Resp:      Complications: No apparent anesthesia complications
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
## 2015-01-04 NOTE — Progress Notes (Signed)
Patient's mother phone number [phone]

## 2015-01-04 NOTE — ED Notes (Signed)
[facility] transferred pt from department

## 2015-01-04 NOTE — ED Notes (Signed)
Pt pressure and HR dropped. Dr Lynelle DoctorKnapp notified and at bedside.

## 2015-01-04 NOTE — Progress Notes (Signed)
Patient alert and oriented x 4 and able to recall what was being done prior to consent being signed.

## 2015-01-04 NOTE — Anesthesia Postprocedure Evaluation (Signed)
Anesthesia Post Note  Patient: Jay Moreno  Procedure(s) Performed: Procedure(s) (LRB): wound exploration, diagnostic laparotomy, possible laparotomy (N/A)  Patient location during evaluation: PACU Anesthesia Type: General Level of consciousness: awake and alert Pain management: pain level controlled Vital Signs Assessment: post-procedure vital signs reviewed and stable Respiratory status: spontaneous breathing, nonlabored ventilation, respiratory function stable and patient connected to nasal cannula oxygen Cardiovascular status: blood pressure returned to baseline and stable Postop Assessment: no signs of nausea or vomiting Anesthetic complications: no    Last Vitals:  Filed Vitals:   01/04/15 1615 01/04/15 1630  BP: 129/66 118/83  Pulse: 77 77  Temp:    Resp: 27 23    Last Pain:  Filed Vitals:   01/04/15 1640  PainSc: 7                  Reino KentJudd, Autie Vasudevan J

## 2015-01-04 NOTE — Anesthesia Procedure Notes (Signed)
Procedure Name: Intubation Date/Time: 01/04/2015 2:30 PM Performed by: Dairl PonderJIANG, Percival Glasheen Pre-anesthesia Checklist: Patient identified, Timeout performed, Emergency Drugs available, Suction available and Patient being monitored Patient Re-evaluated:Patient Re-evaluated prior to inductionOxygen Delivery Method: Circle system utilized Preoxygenation: Pre-oxygenation with 100% oxygen Intubation Type: IV induction Laryngoscope Size: Mac and 4 Grade View: Grade I Tube type: Oral Tube size: 7.5 mm Airway Equipment and Method: Stylet Placement Confirmation: ETT inserted through vocal cords under direct vision,  breath sounds checked- equal and bilateral and positive ETCO2 Secured at: 23 cm Tube secured with: Tape Dental Injury: Teeth and Oropharynx as per pre-operative assessment
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
## 2015-01-04 NOTE — ED Notes (Signed)
Bed: [id number] Expected date:  Expected time:  Means of arrival:  Comments: Hold Rescu A
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
## 2015-01-04 NOTE — Progress Notes (Signed)
Aline discontinued by Spero Curb. Citty RN. Therapy completed. Site Clean and Dry . No active bleeding

## 2015-01-05 DIAGNOSIS — D62 Acute posthemorrhagic anemia: Secondary | ICD-10-CM | POA: Diagnosis not present

## 2015-01-05 DIAGNOSIS — S35299A Unspecified injury of branches of celiac and mesenteric artery, initial encounter: Secondary | ICD-10-CM | POA: Diagnosis present

## 2015-01-05 LAB — CBC
HEMATOCRIT: 31.7 % — AB (ref 39.0–52.0)
Hemoglobin: 10.4 g/dL — ABNORMAL LOW (ref 13.0–17.0)
MCH: 29.1 pg (ref 26.0–34.0)
MCHC: 32.8 g/dL (ref 30.0–36.0)
MCV: 88.5 fL (ref 78.0–100.0)
Platelets: 233 10*3/uL (ref 150–400)
RBC: 3.58 MIL/uL — AB (ref 4.22–5.81)
RDW: 13.6 % (ref 11.5–15.5)
WBC: 9.6 10*3/uL (ref 4.0–10.5)

## 2015-01-05 LAB — BASIC METABOLIC PANEL
ANION GAP: 9 (ref 5–15)
BUN: 9 mg/dL (ref 6–20)
CALCIUM: 8.1 mg/dL — AB (ref 8.9–10.3)
CO2: 25 mmol/L (ref 22–32)
Chloride: 104 mmol/L (ref 101–111)
Creatinine, Ser: 0.9 mg/dL (ref 0.61–1.24)
Glucose, Bld: 120 mg/dL — ABNORMAL HIGH (ref 65–99)
Potassium: 4.1 mmol/L (ref 3.5–5.1)
Sodium: 138 mmol/L (ref 135–145)

## 2015-01-05 LAB — CDS SEROLOGY
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
## 2015-01-06 LAB — TYPE AND SCREEN
ABO/RH(D): O POS
Antibody Screen: NEGATIVE
UNIT DIVISION: 0
UNIT DIVISION: 0
Unit division: 0
Unit division: 0

## 2015-01-06 LAB — BLOOD PRODUCT ORDER (VERBAL) VERIFICATION

## 2015-01-06 MED ORDER — IBUPROFEN 600 MG PO TABS
600.0000 mg | ORAL_TABLET | Freq: Once | ORAL | Status: AC
  Administered 2015-01-06: 600 mg via ORAL
  Filled 2015-01-06: qty 1

## 2015-01-06 MED ORDER — KETOROLAC TROMETHAMINE 15 MG/ML IJ SOLN
15.0000 mg | Freq: Three times a day (TID) | INTRAMUSCULAR | Status: DC | PRN
  Administered 2015-01-06: 15 mg via INTRAVENOUS
  Filled 2015-01-06: qty 1

## 2015-01-06 MED ORDER — ENOXAPARIN SODIUM 40 MG/0.4ML ~~LOC~~ SOLN
40.0000 mg | SUBCUTANEOUS | Status: DC
  Administered 2015-01-06 – 2015-01-08 (×3): 40 mg via SUBCUTANEOUS
  Filled 2015-01-06 (×3): qty 0.4

## 2015-01-06 MED ORDER — KCL IN DEXTROSE-NACL 20-5-0.45 MEQ/L-%-% IV SOLN
INTRAVENOUS | Status: DC
  Administered 2015-01-06 – 2015-01-07 (×2): via INTRAVENOUS
  Filled 2015-01-06 (×2): qty 1000

## 2015-01-06 MED ORDER — OXYCODONE HCL 5 MG PO TABS
10.0000 mg | ORAL_TABLET | ORAL | Status: DC | PRN
  Administered 2015-01-06 – 2015-01-07 (×3): 10 mg via ORAL
  Filled 2015-01-06 (×3): qty 2

## 2015-01-06 MED ORDER — HYDROMORPHONE HCL 1 MG/ML IJ SOLN
1.0000 mg | INTRAMUSCULAR | Status: DC | PRN
  Administered 2015-01-06 – 2015-01-07 (×3): 1 mg via INTRAVENOUS
  Filled 2015-01-06 (×3): qty 1

## 2015-01-06 NOTE — Clinical Social Work Note (Signed)
Clinical Social Worker attempted to visit with patient x2, however he had a visitor at bedside and requested return at a later time.  Patient requesting pain medication - RN updated.  CSW to return to complete full assessment and SBIRT.  CSW available for support as needed.  Macario GoldsJesse Cleland Simkins, KentuckyLCSW [phone]

## 2015-01-06 NOTE — Progress Notes (Signed)
Patient spiked a temp during night. Tylenol was given temp remained at 101.7, HR 126, B/P 118/64, RR 26, and pulse ox 90 on 3 L. Dr. Lindie SpruceWyatt was notified an order was given for a 1 time dose of motrin. Patient has been encouraged throughout night to use incentive he is usually hesitant however will do as ask. He is getting it up to between [phone] and coughing up thick white/yellow mucus. Will continue to monitor. Cindee SaltMcBride,Azalee Weimer K, RN
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
## 2015-01-07 ENCOUNTER — Encounter (HOSPITAL_COMMUNITY): Payer: Self-pay | Admitting: General Surgery

## 2015-01-07 MED ORDER — OXYCODONE HCL 5 MG PO TABS
5.0000 mg | ORAL_TABLET | ORAL | Status: DC | PRN
  Administered 2015-01-07 – 2015-01-08 (×4): 10 mg via ORAL
  Administered 2015-01-08: 5 mg via ORAL
  Administered 2015-01-08: 10 mg via ORAL
  Filled 2015-01-07: qty 1
  Filled 2015-01-07 (×5): qty 2

## 2015-01-07 MED ORDER — HYDROMORPHONE HCL 1 MG/ML IJ SOLN: 0.5000 mg | INTRAMUSCULAR | Status: DC | PRN
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
## 2015-01-07 NOTE — Clinical Social Work Note (Signed)
CSW met with patient this afternoon to assess and discuss his discharge plan. Full assessment to follow. Patient explicitly states that he does NOT want CSW to contact his mother in regards to him, his care, or his discharge plan. Patient verbalizes understanding that it will be his responsibility to discuss his discharge home with his mom if patient will not let CSW speak to the mom.    Jay Moreno  MSW, LCSW, LCASA, [phone] 
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
## 2015-01-08 LAB — TYPE AND SCREEN
ABO/RH(D): O POS
ANTIBODY SCREEN: NEGATIVE
UNIT DIVISION: 0
Unit division: 0
Unit division: 0
Unit division: 0
Unit division: 0
Unit division: 0

## 2015-01-08 MED ORDER — OXYCODONE HCL 5 MG PO TABS: 5.0000 mg | ORAL_TABLET | ORAL | Status: DC | PRN

## 2015-01-08 NOTE — Discharge Instructions (Signed)
[facility]      [facility] [phone]  OPEN ABDOMINAL SURGERY: POST OP INSTRUCTIONS  Always review your discharge instruction sheet given to you by the facility where your surgery was performed.  IF YOU HAVE DISABILITY OR FAMILY LEAVE FORMS, YOU MUST BRING THEM TO THE OFFICE FOR PROCESSING.  PLEASE DO NOT GIVE THEM TO YOUR DOCTOR.  1. A prescription for pain medication may be given to you upon discharge.  Take your pain medication as prescribed, if needed.  If narcotic pain medicine is not needed, then you may take acetaminophen (Tylenol) or ibuprofen (Advil) as needed. 2. Take your usually prescribed medications unless otherwise directed. 3. If you need a refill on your pain medication, please contact your pharmacy. They will contact our office to request authorization.  Prescriptions will not be filled after 5pm or on week-ends. 4. You should follow a light diet the first few days after arrival home, such as soup and crackers, pudding, etc.unless your doctor has advised otherwise. A high-fiber, low fat diet can be resumed as tolerated.   Be sure to include lots of fluids daily. Most patients will experience some swelling and bruising on the chest and neck area.  Ice packs will help.  Swelling and bruising can take several days to resolve 5. Most patients will experience some swelling and bruising in the area of the incision. Ice pack will help. Swelling and bruising can take several days to resolve..  6. It is common to experience some constipation if taking pain medication after surgery.  Increasing fluid intake and taking a stool softener will usually help or prevent this problem from occurring.  A mild laxative (Milk of Magnesia or Miralax) should be taken according to package directions if there are no bowel movements after 48 hours. 7.  You may have steri-strips (small skin tapes) in place directly over the incision.  These strips should be left on the skin for 7-10 days.  If your  surgeon used skin glue on the incision, you may shower in 24 hours.  The glue will flake off over the next 2-3 weeks.  Any sutures or staples will be removed at the office during your follow-up visit. You may find that a light gauze bandage over your incision may keep your staples from being rubbed or pulled. You may shower and replace the bandage daily. 8. ACTIVITIES:  You may resume regular (light) daily activities beginning the next day--such as daily self-care, walking, climbing stairs--gradually increasing activities as tolerated.  You may have sexual intercourse when it is comfortable.  Refrain from any heavy lifting or straining until approved by your doctor. a. You may drive when you no longer are taking prescription pain medication, you can comfortably wear a seatbelt, and you can safely maneuver your car and apply brakes b. Return to Work: ___________________________________ 9. You should see your doctor in the office for a follow-up appointment approximately two weeks after your surgery.  Make sure that you call for this appointment within a day or two after you arrive home to insure a convenient appointment time. OTHER INSTRUCTIONS:  _____________________________________________________________ _____________________________________________________________  WHEN TO CALL YOUR DOCTOR: 1. Fever over 101.0 2. Inability to urinate 3. Nausea and/or vomiting 4. Extreme swelling or bruising 5. Continued bleeding from incision. 6. Increased pain, redness, or drainage from the incision. 7. Difficulty swallowing or breathing 8. Muscle cramping or spasms. 9. Numbness or tingling in hands or feet or around lips.  The clinic staff is available to   answer your questions during regular business hours.  Please don't hesitate to call and ask to speak to one of the nurses if you have concerns.  For further questions, please visit [email or web address]   
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
## 2015-01-08 NOTE — Progress Notes (Signed)
Patient discharged to home with instructions. 
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
## 2015-01-08 NOTE — Clinical Social Work Note (Signed)
CSW met with patient and mom at bedside. All DC needs addressed by [facility] and CSW. Patient states he DOES want his mom to be involved at this time. CSW signing off.   Liz Beach MSW, [place], [place], [phone]
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
## 2015-08-28 ENCOUNTER — Encounter (HOSPITAL_COMMUNITY): Payer: Self-pay | Admitting: Emergency Medicine

## 2015-08-28 ENCOUNTER — Emergency Department (HOSPITAL_COMMUNITY)
Admission: EM | Admit: 2015-08-28 | Discharge: 2015-08-29 | Disposition: A | Discharge status: Home / Self Care | Payer: Self-pay | Attending: Emergency Medicine | Admitting: Emergency Medicine

## 2015-08-28 DIAGNOSIS — F909 Attention-deficit hyperactivity disorder, unspecified type: Secondary | ICD-10-CM | POA: Insufficient documentation

## 2015-08-28 DIAGNOSIS — F1290 Cannabis use, unspecified, uncomplicated: Secondary | ICD-10-CM | POA: Insufficient documentation

## 2015-08-28 DIAGNOSIS — F17210 Nicotine dependence, cigarettes, uncomplicated: Secondary | ICD-10-CM | POA: Insufficient documentation

## 2015-08-28 DIAGNOSIS — F1520 Other stimulant dependence, uncomplicated: Secondary | ICD-10-CM | POA: Insufficient documentation

## 2015-08-28 DIAGNOSIS — F1994 Other psychoactive substance use, unspecified with psychoactive substance-induced mood disorder: Secondary | ICD-10-CM | POA: Diagnosis present

## 2015-08-28 DIAGNOSIS — Z5181 Encounter for therapeutic drug level monitoring: Secondary | ICD-10-CM | POA: Insufficient documentation

## 2015-08-28 LAB — SALICYLATE LEVEL

## 2015-08-28 LAB — COMPREHENSIVE METABOLIC PANEL
ALT: 28 U/L (ref 17–63)
AST: 23 U/L (ref 15–41)
Albumin: 4.6 g/dL (ref 3.5–5.0)
Alkaline Phosphatase: 77 U/L (ref 38–126)
Anion gap: 8 (ref 5–15)
BUN: 16 mg/dL (ref 6–20)
CHLORIDE: 108 mmol/L (ref 101–111)
CO2: 24 mmol/L (ref 22–32)
Calcium: 9.3 mg/dL (ref 8.9–10.3)
Creatinine, Ser: 0.99 mg/dL (ref 0.61–1.24)
GFR calc Af Amer: >60 mL/min (ref >60)
Glucose, Bld: 91 mg/dL (ref 65–99)
POTASSIUM: 3.6 mmol/L (ref 3.5–5.1)
Sodium: 140 mmol/L (ref 135–145)
Total Bilirubin: 1 mg/dL (ref 0.3–1.2)
Total Protein: 7.9 g/dL (ref 6.5–8.1)

## 2015-08-28 LAB — RAPID URINE DRUG SCREEN, HOSP PERFORMED
AMPHETAMINES: POSITIVE — AB
BARBITURATES: NOT DETECTED
Benzodiazepines: NOT DETECTED
Cocaine: NOT DETECTED
Opiates: NOT DETECTED
TETRAHYDROCANNABINOL: POSITIVE — AB

## 2015-08-28 LAB — CK: CK TOTAL: 392 U/L (ref 49–397)

## 2015-08-28 LAB — CBC WITH DIFFERENTIAL/PLATELET
BASOS ABS: 0 10*3/uL (ref 0.0–0.1)
BASOS PCT: 0 %
EOS ABS: 0.2 10*3/uL (ref 0.0–0.7)
EOS PCT: 1 %
HCT: 41.1 % (ref 39.0–52.0)
Hemoglobin: 14.8 g/dL (ref 13.0–17.0)
LYMPHS PCT: 13 %
Lymphs Abs: 1.6 10*3/uL (ref 0.7–4.0)
MCH: 30.2 pg (ref 26.0–34.0)
MCHC: 36 g/dL (ref 30.0–36.0)
MCV: 83.9 fL (ref 78.0–100.0)
MONO ABS: 0.8 10*3/uL (ref 0.1–1.0)
Monocytes Relative: 7 %
Neutro Abs: 9.3 10*3/uL — ABNORMAL HIGH (ref 1.7–7.7)
Neutrophils Relative %: 79 %
PLATELETS: 329 10*3/uL (ref 150–400)
RBC: 4.9 MIL/uL (ref 4.22–5.81)
RDW: 12.8 % (ref 11.5–15.5)
WBC: 11.9 10*3/uL — ABNORMAL HIGH (ref 4.0–10.5)

## 2015-08-28 LAB — ETHANOL

## 2015-08-28 LAB — ACETAMINOPHEN LEVEL

## 2015-08-28 MED ORDER — HALOPERIDOL LACTATE 5 MG/ML IJ SOLN
5.0000 mg | Freq: Once | INTRAMUSCULAR | Status: DC
  Filled 2015-08-28: qty 1

## 2015-08-28 MED ORDER — LORAZEPAM 1 MG PO TABS: 1.0000 mg | ORAL_TABLET | Freq: Three times a day (TID) | ORAL | Status: DC | PRN

## 2015-08-28 MED ORDER — LORAZEPAM 2 MG/ML IJ SOLN: 2.0000 mg | Freq: Once | INTRAMUSCULAR | Status: DC

## 2015-08-28 MED ORDER — SODIUM CHLORIDE 0.9 % IV BOLUS (SEPSIS)
2000.0000 mL | Freq: Once | INTRAVENOUS | Status: AC
  Administered 2015-08-28: 2000 mL via INTRAVENOUS

## 2015-08-28 MED ORDER — LORAZEPAM 2 MG/ML IJ SOLN
2.0000 mg | Freq: Once | INTRAMUSCULAR | Status: DC
Start: 2015-08-28 — End: 2015-08-29
  Filled 2015-08-28: qty 1

## 2015-08-28 MED ORDER — SODIUM CHLORIDE 0.9 % IV SOLN: INTRAVENOUS | Status: DC

## 2015-08-28 MED ORDER — IBUPROFEN 200 MG PO TABS
600.0000 mg | ORAL_TABLET | Freq: Three times a day (TID) | ORAL | Status: DC | PRN
  Filled 2015-08-28: qty 3

## 2015-08-28 MED ORDER — ACETAMINOPHEN 325 MG PO TABS: 650.0000 mg | ORAL_TABLET | ORAL | Status: DC | PRN

## 2015-08-28 MED ORDER — NICOTINE 21 MG/24HR TD PT24
21.0000 mg | MEDICATED_PATCH | Freq: Every day | TRANSDERMAL | Status: DC
Start: 2015-08-28 — End: 2015-08-29
  Administered 2015-08-28: 21 mg via TRANSDERMAL
  Filled 2015-08-28: qty 1

## 2015-08-28 MED ORDER — ALUM & MAG HYDROXIDE-SIMETH 200-200-20 MG/5ML PO SUSP: 30.0000 mL | ORAL | Status: DC | PRN

## 2015-08-28 MED ORDER — ONDANSETRON HCL 4 MG PO TABS: 4.0000 mg | ORAL_TABLET | Freq: Three times a day (TID) | ORAL | Status: DC | PRN
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
## 2015-08-28 NOTE — ED Notes (Signed)
Nurse to draw labs.

## 2015-08-28 NOTE — ED Notes (Signed)
Bed: WA33 Expected date:  Expected time:  Means of arrival:  Comments: 

## 2015-08-29 DIAGNOSIS — F1994 Other psychoactive substance use, unspecified with psychoactive substance-induced mood disorder: Secondary | ICD-10-CM | POA: Diagnosis present

## 2015-08-29 DIAGNOSIS — F1520 Other stimulant dependence, uncomplicated: Secondary | ICD-10-CM | POA: Diagnosis present

## 2015-08-29 MED ORDER — GABAPENTIN 100 MG PO CAPS: 200.0000 mg | ORAL_CAPSULE | Freq: Two times a day (BID) | ORAL | Status: DC

## 2015-08-29 NOTE — ED Notes (Signed)
Patient calm and cooperative.  Patient follows directions.  Patient does not respond appropriately at times.

## 2015-08-29 NOTE — Discharge Instructions (Signed)
To help you maintain a sober lifestyle, a substance abuse treatment program may be beneficial to you.  Contact [facility] at your earliest opportunity to ask about enrolling in their program: ° °     [facility] °     [address], [address] °     [place],  [address] °     [phone] °     New patients are seen at the walk-in clinic every Tuesday from 9:00 am - 12:00 pm. °
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
## 2015-08-29 NOTE — ED Notes (Signed)
[employer] left patient bedside.  Writer informed [employer] that staff was able to manage patient behavior at this point.

## 2015-08-29 NOTE — ED Notes (Signed)
Patient asked for something for generalized pain.  Writer took patient 600mg  of ibuprofen.  Patient refused. Medication.  Medication was washed down the sink.

## 2015-08-29 NOTE — ED Notes (Signed)
Patient taken out of handcuffs.  Patient calm and cooperative.

## 2015-08-29 NOTE — BH Assessment (Signed)
[facility] Assessment Progress Note  Per Mojeed Akintayo, MD, this pt does not require psychiatric hospitalization at this time.  Pt presents under IVC, which Dr Akintayo has rescinded.  [facility] from [facility] with recommendation to [facility] with [facility].  This has been included in pt's discharge instructions.  Pt's nurse has been notified.  Cotey Rakes, MA Triage Specialist [phone]     

## 2015-09-21 ENCOUNTER — Encounter (HOSPITAL_BASED_OUTPATIENT_CLINIC_OR_DEPARTMENT_OTHER): Payer: Self-pay | Admitting: Emergency Medicine

## 2015-09-21 ENCOUNTER — Emergency Department (HOSPITAL_BASED_OUTPATIENT_CLINIC_OR_DEPARTMENT_OTHER)
Admission: EM | Admit: 2015-09-21 | Discharge: 2015-09-21 | Disposition: A | Discharge status: Home / Self Care | Payer: Self-pay | Attending: Emergency Medicine | Admitting: Emergency Medicine

## 2015-09-21 DIAGNOSIS — F17210 Nicotine dependence, cigarettes, uncomplicated: Secondary | ICD-10-CM | POA: Insufficient documentation

## 2015-09-21 DIAGNOSIS — L03114 Cellulitis of left upper limb: Secondary | ICD-10-CM | POA: Insufficient documentation

## 2015-09-21 DIAGNOSIS — F909 Attention-deficit hyperactivity disorder, unspecified type: Secondary | ICD-10-CM | POA: Insufficient documentation

## 2015-09-21 DIAGNOSIS — Z79899 Other long term (current) drug therapy: Secondary | ICD-10-CM | POA: Insufficient documentation

## 2015-09-21 MED ORDER — CEPHALEXIN 500 MG PO CAPS: 500.0000 mg | ORAL_CAPSULE | Freq: Four times a day (QID) | ORAL | 0 refills | Status: DC

## 2015-09-21 MED ORDER — SULFAMETHOXAZOLE-TRIMETHOPRIM 800-160 MG PO TABS: 1.0000 | ORAL_TABLET | Freq: Two times a day (BID) | ORAL | 0 refills | Status: AC

## 2015-09-21 NOTE — ED Notes (Signed)
Attempted to call patient to room x1 with no response;

## 2015-09-21 NOTE — ED Notes (Signed)
MD at bedside. 

## 2015-09-21 NOTE — ED Triage Notes (Addendum)
Patient reports pain to left elbow since yesterday.  Erythema and swelling noted.  Denies injury.  Patient is occassional IV drug user.

## 2015-09-21 NOTE — ED Notes (Signed)
Pt given d/c instructions as per chart. Verbalizes understanding. No questions. Rx x 2. 

## 2015-09-21 NOTE — Discharge Instructions (Signed)
Take 4 over the counter ibuprofen tablets 3 times a day or 2 over-the-counter naproxen tablets twice a day for pain. Also take tylenol 1000mg(2 extra strength) four times a day.    
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
## 2015-09-21 NOTE — ED Notes (Addendum)
Patient reports he did leave ER when called for triage previously but he returned.

## 2015-09-28 ENCOUNTER — Encounter (HOSPITAL_BASED_OUTPATIENT_CLINIC_OR_DEPARTMENT_OTHER): Payer: Self-pay | Admitting: *Deleted

## 2015-09-28 ENCOUNTER — Emergency Department (HOSPITAL_BASED_OUTPATIENT_CLINIC_OR_DEPARTMENT_OTHER)
Admission: EM | Admit: 2015-09-28 | Discharge: 2015-09-28 | Disposition: A | Discharge status: Home / Self Care | Payer: Self-pay | Attending: Emergency Medicine | Admitting: Emergency Medicine

## 2015-09-28 DIAGNOSIS — F909 Attention-deficit hyperactivity disorder, unspecified type: Secondary | ICD-10-CM | POA: Insufficient documentation

## 2015-09-28 DIAGNOSIS — L02414 Cutaneous abscess of left upper limb: Secondary | ICD-10-CM | POA: Insufficient documentation

## 2015-09-28 DIAGNOSIS — L0291 Cutaneous abscess, unspecified: Secondary | ICD-10-CM

## 2015-09-28 DIAGNOSIS — F17210 Nicotine dependence, cigarettes, uncomplicated: Secondary | ICD-10-CM | POA: Insufficient documentation

## 2015-09-28 MED ORDER — LIDOCAINE-EPINEPHRINE (PF) 2 %-1:200000 IJ SOLN: 10.0000 mL | Freq: Once | INTRAMUSCULAR | Status: DC

## 2015-09-28 MED ORDER — LIDOCAINE-EPINEPHRINE (PF) 2 %-1:200000 IJ SOLN: 20.0000 mL | Freq: Once | INTRAMUSCULAR | Status: DC

## 2015-09-28 MED ORDER — LIDOCAINE-EPINEPHRINE (PF) 2 %-1:200000 IJ SOLN
INTRAMUSCULAR | Status: AC
  Filled 2015-09-28: qty 10

## 2015-09-28 MED ORDER — LIDOCAINE-EPINEPHRINE 1 %-1:100000 IJ SOLN
10.0000 mL | Freq: Once | INTRAMUSCULAR | Status: AC
  Administered 2015-09-28: 10 mL via INTRADERMAL

## 2015-09-28 NOTE — ED Notes (Signed)
Dr. Oni at BS. 
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
## 2015-09-28 NOTE — ED Notes (Signed)
[facility], pt alert, NAD, calm, interactive, resps e/u, no dyspnea noted, here for L elbow abscess (red, dry, raised, flaky, cracked patch), no drainage, c/o 10/10 pain, TTP. CMS/ ROM intact.

## 2016-01-01 ENCOUNTER — Ambulatory Visit (HOSPITAL_COMMUNITY): Admission: RE | Admit: 2016-01-01 | Payer: Self-pay | Source: Home / Self Care | Admitting: Psychiatry

## 2016-01-14 ENCOUNTER — Emergency Department (HOSPITAL_COMMUNITY)
Admission: EM | Admit: 2016-01-14 | Discharge: 2016-01-16 | Disposition: A | Discharge status: Other Acute Inpatient Hospital | Payer: Federal, State, Local not specified - Other | Attending: Emergency Medicine | Admitting: Emergency Medicine

## 2016-01-14 ENCOUNTER — Encounter (HOSPITAL_COMMUNITY): Payer: Self-pay | Admitting: Emergency Medicine

## 2016-01-14 DIAGNOSIS — F323 Major depressive disorder, single episode, severe with psychotic features: Secondary | ICD-10-CM | POA: Diagnosis present

## 2016-01-14 DIAGNOSIS — F17210 Nicotine dependence, cigarettes, uncomplicated: Secondary | ICD-10-CM | POA: Insufficient documentation

## 2016-01-14 DIAGNOSIS — F19950 Other psychoactive substance use, unspecified with psychoactive substance-induced psychotic disorder with delusions: Secondary | ICD-10-CM

## 2016-01-14 DIAGNOSIS — F909 Attention-deficit hyperactivity disorder, unspecified type: Secondary | ICD-10-CM | POA: Insufficient documentation

## 2016-01-14 DIAGNOSIS — F1520 Other stimulant dependence, uncomplicated: Secondary | ICD-10-CM | POA: Diagnosis present

## 2016-01-14 DIAGNOSIS — F1924 Other psychoactive substance dependence with psychoactive substance-induced mood disorder: Secondary | ICD-10-CM | POA: Insufficient documentation

## 2016-01-14 DIAGNOSIS — Z79899 Other long term (current) drug therapy: Secondary | ICD-10-CM | POA: Insufficient documentation

## 2016-01-14 LAB — RAPID URINE DRUG SCREEN, HOSP PERFORMED
AMPHETAMINES: POSITIVE — AB
Barbiturates: NOT DETECTED
Benzodiazepines: NOT DETECTED
Cocaine: NOT DETECTED
OPIATES: NOT DETECTED
Tetrahydrocannabinol: NOT DETECTED

## 2016-01-14 LAB — COMPREHENSIVE METABOLIC PANEL
ALBUMIN: 4.5 g/dL (ref 3.5–5.0)
ALT: 36 U/L (ref 17–63)
AST: 29 U/L (ref 15–41)
Alkaline Phosphatase: 72 U/L (ref 38–126)
Anion gap: 11 (ref 5–15)
BUN: 10 mg/dL (ref 6–20)
CHLORIDE: 102 mmol/L (ref 101–111)
CO2: 24 mmol/L (ref 22–32)
CREATININE: 0.83 mg/dL (ref 0.61–1.24)
Calcium: 9 mg/dL (ref 8.9–10.3)
GFR calc Af Amer: >60 mL/min (ref >60)
GFR calc non Af Amer: >60 mL/min (ref >60)
GLUCOSE: 99 mg/dL (ref 65–99)
POTASSIUM: 3.6 mmol/L (ref 3.5–5.1)
SODIUM: 137 mmol/L (ref 135–145)
Total Bilirubin: 0.8 mg/dL (ref 0.3–1.2)
Total Protein: 7.9 g/dL (ref 6.5–8.1)

## 2016-01-14 LAB — CBC
HCT: 41 % (ref 39.0–52.0)
Hemoglobin: 14.4 g/dL (ref 13.0–17.0)
MCH: 29.4 pg (ref 26.0–34.0)
MCHC: 35.1 g/dL (ref 30.0–36.0)
MCV: 83.8 fL (ref 78.0–100.0)
PLATELETS: 354 10*3/uL (ref 150–400)
RBC: 4.89 MIL/uL (ref 4.22–5.81)
RDW: 12.9 % (ref 11.5–15.5)
WBC: 8.8 10*3/uL (ref 4.0–10.5)

## 2016-01-14 LAB — SALICYLATE LEVEL: Salicylate Lvl: <7 mg/dL (ref 2.8–30.0)

## 2016-01-14 LAB — ACETAMINOPHEN LEVEL

## 2016-01-14 LAB — ETHANOL: Alcohol, Ethyl (B): <5 mg/dL (ref <5)
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
## 2016-01-15 DIAGNOSIS — F323 Major depressive disorder, single episode, severe with psychotic features: Secondary | ICD-10-CM | POA: Diagnosis present

## 2016-01-15 DIAGNOSIS — F19950 Other psychoactive substance use, unspecified with psychoactive substance-induced psychotic disorder with delusions: Secondary | ICD-10-CM

## 2016-01-15 MED ORDER — OLANZAPINE 5 MG PO TABS
7.5000 mg | ORAL_TABLET | Freq: Every day | ORAL | Status: DC
  Administered 2016-01-15: 21:00:00 7.5 mg via ORAL
  Filled 2016-01-15: qty 1

## 2016-01-15 MED ORDER — HYDROXYZINE HCL 25 MG PO TABS
25.0000 mg | ORAL_TABLET | Freq: Three times a day (TID) | ORAL | Status: DC | PRN
  Administered 2016-01-15: 25 mg via ORAL
  Filled 2016-01-15: qty 1

## 2016-01-15 MED ORDER — GABAPENTIN 300 MG PO CAPS
300.0000 mg | ORAL_CAPSULE | Freq: Three times a day (TID) | ORAL | Status: DC
  Administered 2016-01-15 – 2016-01-16 (×3): 300 mg via ORAL
  Filled 2016-01-15 (×3): qty 1

## 2016-01-15 NOTE — Progress Notes (Signed)
01/15/16 1342:  LRT played a game of Keep It Going Volleyball with pt and two peers.  Pt was bright, social and smiling throughout group.  Pt seemed to happy to be able to do any type of activity.  Caroll RancherMarjette Ashanti Littles, LRT/CTRS

## 2016-01-15 NOTE — [facility] Assessment (Signed)
[facility] Assessment Progress Note  Per Mojeed Akintayo, MD, this pt requires psychiatric hospitalization at this time.  The following facilities have been contacted to seek placement for this pt, with results as noted:  Beds available, information sent, decision pending:  [facility]   At capacity:  [facility]   Marti Acebo, MA Triage Specialist [phone]     
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
## 2016-01-15 NOTE — ED Notes (Signed)
Pt sleeping at present, no distress noted, calm & cooperative at present.  Monitoring for safety, Q 15 min checks in effect. 

## 2016-01-15 NOTE — ED Notes (Signed)
This patient is very bizarre.  He is having flight of ideas.  He denies S/I and H/I and AVH

## 2016-01-15 NOTE — BHH Counselor (Signed)
Per Casimiro NeedleMichael at [facility] the provider denied the pt. TTS will continue to seek placement.   Gwinda Passereylese D Bennett, MS, [facility], [facility] Triage Specialist [phone]
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
## 2016-01-16 ENCOUNTER — Encounter (HOSPITAL_COMMUNITY): Payer: Self-pay

## 2016-01-16 ENCOUNTER — Inpatient Hospital Stay (HOSPITAL_COMMUNITY)
Admission: AD | Admit: 2016-01-16 | Discharge: 2016-01-20 | DRG: 897 | Disposition: A | Discharge status: Home / Self Care | Payer: Federal, State, Local not specified - Other | Attending: Psychiatry | Admitting: Psychiatry

## 2016-01-16 DIAGNOSIS — Z87820 Personal history of traumatic brain injury: Secondary | ICD-10-CM | POA: Diagnosis not present

## 2016-01-16 DIAGNOSIS — F17210 Nicotine dependence, cigarettes, uncomplicated: Secondary | ICD-10-CM | POA: Diagnosis present

## 2016-01-16 DIAGNOSIS — F19950 Other psychoactive substance use, unspecified with psychoactive substance-induced psychotic disorder with delusions: Secondary | ICD-10-CM

## 2016-01-16 DIAGNOSIS — F323 Major depressive disorder, single episode, severe with psychotic features: Secondary | ICD-10-CM

## 2016-01-16 DIAGNOSIS — F1520 Other stimulant dependence, uncomplicated: Principal | ICD-10-CM | POA: Diagnosis present

## 2016-01-16 DIAGNOSIS — Z9889 Other specified postprocedural states: Secondary | ICD-10-CM

## 2016-01-16 DIAGNOSIS — F411 Generalized anxiety disorder: Secondary | ICD-10-CM | POA: Diagnosis present

## 2016-01-16 DIAGNOSIS — F322 Major depressive disorder, single episode, severe without psychotic features: Secondary | ICD-10-CM | POA: Diagnosis present

## 2016-01-16 DIAGNOSIS — Z8659 Personal history of other mental and behavioral disorders: Secondary | ICD-10-CM | POA: Diagnosis not present

## 2016-01-16 DIAGNOSIS — Z79899 Other long term (current) drug therapy: Secondary | ICD-10-CM | POA: Diagnosis not present

## 2016-01-16 DIAGNOSIS — Z79891 Long term (current) use of opiate analgesic: Secondary | ICD-10-CM

## 2016-01-16 DIAGNOSIS — R45851 Suicidal ideations: Secondary | ICD-10-CM

## 2016-01-16 MED ORDER — ACETAMINOPHEN 325 MG PO TABS: 650.0000 mg | ORAL_TABLET | Freq: Four times a day (QID) | ORAL | Status: DC | PRN

## 2016-01-16 MED ORDER — GABAPENTIN 300 MG PO CAPS
300.0000 mg | ORAL_CAPSULE | Freq: Three times a day (TID) | ORAL | Status: DC
  Administered 2016-01-16 – 2016-01-17 (×2): 300 mg via ORAL
  Filled 2016-01-16 (×9): qty 1

## 2016-01-16 MED ORDER — ALUM & MAG HYDROXIDE-SIMETH 200-200-20 MG/5ML PO SUSP: 30.0000 mL | ORAL | Status: DC | PRN

## 2016-01-16 MED ORDER — OLANZAPINE 10 MG PO TABS
10.0000 mg | ORAL_TABLET | Freq: Two times a day (BID) | ORAL | Status: DC
  Administered 2016-01-16 – 2016-01-17 (×2): 10 mg via ORAL
  Filled 2016-01-16 (×7): qty 1

## 2016-01-16 MED ORDER — TRAZODONE HCL 50 MG PO TABS
50.0000 mg | ORAL_TABLET | Freq: Every evening | ORAL | Status: DC | PRN
  Administered 2016-01-16: 50 mg via ORAL
  Filled 2016-01-16 (×6): qty 1

## 2016-01-16 MED ORDER — MAGNESIUM HYDROXIDE 400 MG/5ML PO SUSP: 30.0000 mL | Freq: Every day | ORAL | Status: DC | PRN

## 2016-01-16 MED ORDER — HYDROXYZINE HCL 25 MG PO TABS
25.0000 mg | ORAL_TABLET | Freq: Three times a day (TID) | ORAL | Status: DC | PRN
  Administered 2016-01-16 – 2016-01-19 (×4): 25 mg via ORAL
  Filled 2016-01-16: qty 20
  Filled 2016-01-16 (×4): qty 1
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
## 2016-01-16 NOTE — Progress Notes (Signed)
01/16/16 1339:  LRT went to pt room to offer activities, pt was sleep.  Caroll RancherMarjette Lamonta Cyp[place], LRT/CTRS

## 2016-01-16 NOTE — Progress Notes (Signed)
D: Pt denies SI/HI/AVH. Pt is pleasant and cooperative. Pt presents with flight of ideas , and delusional thinking at times. 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   A: Pt was offered support and encouragement. Pt was given scheduled medications. Pt was encourage to attend groups. Q 15 minute checks were done for safety.   R:Pt attends groups and interacts well with peers and staff. Pt is taking medication. Pt has no complaints.Pt receptive to treatment and safety maintained on unit.
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
## 2016-01-16 NOTE — ED Notes (Signed)
[facility]  All belongings sent with pt.  Pt was calm and cooperative at d/c

## 2016-01-16 NOTE — Tx Team (Signed)
Initial Treatment Plan 01/16/2016 8:52 PM Jay Moreno [id number]    PATIENT STRESSORS: Financial difficulties Substance abuse   PATIENT STRENGTHS: Capable of independent living General fund of knowledge Physical Health   PATIENT IDENTIFIED PROBLEMS: Psychosis  Substance abuse  When asked his goals, these were his responses:  "I don't know, that's a broad question"  "Generic response"             DISCHARGE CRITERIA:  Improved stabilization in mood, thinking, and/or behavior Verbal commitment to aftercare and medication compliance Withdrawal symptoms are absent or subacute and managed without 24-hour nursing intervention  PRELIMINARY DISCHARGE PLAN: Outpatient therapy Medication management  PATIENT/FAMILY INVOLVEMENT: This treatment plan has been presented to and reviewed with the patient, Jay Moreno.  The patient and family have been given the opportunity to ask questions and make suggestions.  Levin BaconHeather V Brack Shaddock, RN 01/16/2016, 8:52 PM

## 2016-01-17 DIAGNOSIS — Z79899 Other long term (current) drug therapy: Secondary | ICD-10-CM

## 2016-01-17 DIAGNOSIS — F1520 Other stimulant dependence, uncomplicated: Principal | ICD-10-CM

## 2016-01-17 DIAGNOSIS — F322 Major depressive disorder, single episode, severe without psychotic features: Secondary | ICD-10-CM

## 2016-01-17 MED ORDER — LORAZEPAM 1 MG PO TABS: 1.0000 mg | ORAL_TABLET | Freq: Three times a day (TID) | ORAL | Status: DC | PRN

## 2016-01-17 MED ORDER — OLANZAPINE 10 MG PO TABS
10.0000 mg | ORAL_TABLET | Freq: Every day | ORAL | Status: DC
  Administered 2016-01-17 – 2016-01-19 (×3): 10 mg via ORAL
  Filled 2016-01-17 (×5): qty 1
  Filled 2016-01-17: qty 14

## 2016-01-17 MED ORDER — TRAZODONE HCL 50 MG PO TABS
50.0000 mg | ORAL_TABLET | Freq: Every evening | ORAL | Status: DC | PRN
  Administered 2016-01-17 – 2016-01-19 (×3): 50 mg via ORAL
  Filled 2016-01-17 (×2): qty 1
  Filled 2016-01-17: qty 15

## 2016-01-17 MED ORDER — GABAPENTIN 100 MG PO CAPS
100.0000 mg | ORAL_CAPSULE | Freq: Three times a day (TID) | ORAL | Status: DC
  Administered 2016-01-17 – 2016-01-20 (×10): 100 mg via ORAL
  Filled 2016-01-17: qty 1
  Filled 2016-01-17: qty 42
  Filled 2016-01-17 (×5): qty 1
  Filled 2016-01-17: qty 42
  Filled 2016-01-17: qty 1
  Filled 2016-01-17: qty 42
  Filled 2016-01-17 (×6): qty 1

## 2016-01-17 NOTE — Plan of Care (Signed)
Problem: Safety: Goal: Periods of time without injury will increase Outcome: Progressing Q 15 minutes safety checks continues without self harm gestures to note thus far this shift.  Problem: Nutritional: Goal: Ability to achieve adequate nutritional intake will improve Outcome: Progressing Pt tolerated all PO intake well when offered. Per MHT's report--Pt ate 100% of meals in cafeteria.
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
## 2016-01-17 NOTE — Progress Notes (Signed)
D: Pt denies SI/HI/AVH. Pt is pleasant and cooperative. Pt keeps to himself and stays in his room most of the evening. Pt forwards little information, pt appears suspicious and paranoid .   A: Pt was offered support and encouragement. Pt was given scheduled medications. Pt was encourage to attend groups. Q 15 minute checks were done for safety.   R:.Pt receptive to treatment and safety maintained on unit.
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
## 2016-01-17 NOTE — [facility] Group Notes (Signed)
[facility] Group Notes: (Clinical Social Work)   01/17/2016      Type of Therapy:  Group Therapy   Participation Level:  Did Not Attend despite MHT prompting   Laurann Mcmorris Grossman-Orr, LCSW 01/17/2016, 12:25 PM     

## 2016-01-17 NOTE — [facility] Counselor (Signed)
Clinical Social Work Note  CSW started psychosocial assessment with patient, but he was unable to respond to questions in a meaningful fashion, for instance, when asked if he had any learning stressors, asked "what is learning anyway?" and when asked if he has used any drugs or alcohol in the last 12 months, responded, "what are drugs and alcohol anyway?"  After just a few minutes in which CSW tried to skip around and ask closed-ended questions, he became angry and told CSW to leave his room.  Will try again tomorrow.  Ambrose MantleMareida Grossman-Orr, LCSW 01/17/2016, 4:07 PM

## 2016-01-17 NOTE — [facility] Group Notes (Addendum)
[facility] Group Notes:  (Nursing/Healthy Coping Skills)  Date:  01/17/2016  Time: 1000 Type of Therapy:  Nurse Education  Participation Level:  Did Not Attend  Summary of Progress/Problems: Pt did not attend group as scheduled. Writer continue to encourage pt to comply with unit routines.  Sherryl MangesWesseh, Kamrynn Melott 01/17/2016, 1000

## 2016-01-17 NOTE — Progress Notes (Signed)
D: Pt presented animated on initial contact. Denies SI, HI, AVH and pain when assessed. However, pt was observed with intermittent giggling inappropriately at medication window, sometimes tangential and disorganized in his speech and slow to respond as if thought blocking. Minimal but appropriate interactions observed with others.   A: Scheduled medications administered as prescribed with verbal education. EKG done and placed in front of chart. Support and availability provided to pt. Encouraged pt to voice concerns, attend unit groups and comply with treatment. Q 15 minutes safety checks maintained as ordered without self harm gestures or outburst to note at this time.  R: Pt is receptive to care. Compliant with medications as ordered. Denies adverse drug reactions when assessed. Pt did not attend group this AM as encouraged. Tolerated EKG procedure and all PO intake well. Remains safe on and off unit. POC effective for safety and mood stability.

## 2016-01-18 DIAGNOSIS — F17210 Nicotine dependence, cigarettes, uncomplicated: Secondary | ICD-10-CM

## 2016-01-18 DIAGNOSIS — Z9889 Other specified postprocedural states: Secondary | ICD-10-CM

## 2016-01-18 LAB — LIPID PANEL
CHOLESTEROL: 137 mg/dL (ref 0–200)
HDL: 45 mg/dL (ref >40)
LDL CALC: 68 mg/dL (ref 0–99)
Total CHOL/HDL Ratio: 3 RATIO
Triglycerides: 122 mg/dL (ref <150)
VLDL: 24 mg/dL (ref 0–40)

## 2016-01-18 LAB — TSH: TSH: 0.996 u[IU]/mL (ref 0.350–4.500)

## 2016-01-18 LAB — CK: Total CK: 77 U/L (ref 49–397)

## 2016-01-18 NOTE — Progress Notes (Signed)
D: Pt visible in dayroom with peers watching TV. Presents animated, anxious and suspicious but has been cooperative with care and unit routines thus far. Pt rated his depression, anxiety and hopelessness all 0/10 on self inventory sheet. Reported sleeping good last night with good appetite, normal energy and good concentration level. Pt denies SI, HI, AVH and pain but observed to be disorganized, argumentative and tangential. Asked writer "why y'all telling me that this cup is blue, It can not be blue" during morning medication pass. A: Scheduled medications administered as prescribed with verbal education. Support and availability provided to pt. Encouraged pt to voice concerns, attend unit groups and comply with treatment. Q 15 minutes safety checks maintained as ordered without self harm gestures or outburst to note at this time.  R: Pt is receptive to care. Pt has been more interactive today compared to yesterday.Took his medications without issues. Tolerated all PO intake well. Pt attended noon group and was verbally engaged in discussion. Remains safe on and off unit. POC continues for safety and mood stability.

## 2016-01-18 NOTE — [facility] Group Notes (Signed)
[facility] Group Notes:  (Nursing/MHT/Case Management/Adjunct)  Date:  01/18/2016  Time:  9:56 AM  Type of Therapy:  Psychoeducational Skills  Participation Level:  Did Not Attend  Participation Quality:  Did Not Attend  Affect:  Did Not Attend  Cognitive:  Did Not Attend  Insight:  None  Engagement in Group:  Did Not Attend  Modes of Intervention:  Did Not Attend  Summary of Progress/Problems: Pt did not attend patient self inventory group.    Symeon Puleo Shanta 01/18/2016, 9:56 AM 
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
## 2016-01-18 NOTE — Progress Notes (Signed)
  D: Pt was pleasant and cooperative, however pt didn't engage in conversation much with the Clinical research associatewriter. Informed that he didn't have any questions or concerns. Denied all hallucinations and thoughts of self harm, but didn't elaborate on any questions. Pt has no questions or concerns.    A:  Support and encouragement was offered. 15 min checks continued for safety.  R: Pt remains safe.

## 2016-01-18 NOTE — Progress Notes (Signed)
Adult Psychoeducational Group Note  Date:  01/18/2016 Time:  8:56 PM  Group Topic/Focus:  Wrap-Up Group:   The focus of this group is to help patients review their daily goal of treatment and discuss progress on daily workbooks.   Participation Level:  Active  Participation Quality:  Appropriate and Attentive  Affect:  Appropriate  Cognitive:  Appropriate  Insight: Appropriate  Engagement in Group:  Engaged  Modes of Intervention:  Discussion  Additional Comments:  Pt stated she had a good day. Pt stated his day got progressively better. Pt was encouraged to make his needs known to staff. Caswell CorwinOwen, Trayton Szabo C 01/18/2016, 8:56 PM

## 2016-01-18 NOTE — [facility] Group Notes (Signed)
[facility] Group Notes:  (Clinical Social Work)  01/18/2016  11:00AM-12:00PM  Summary of Progress/Problems:  The main focus of today's process group was to listen to a variety of genres of music and to identify that different types of music provoke different responses.  The patient then was able to identify personally what was soothing for them, as well as energizing, as well as how patient can personally use this knowledge in sleep habits, with depression, and with other symptoms.  The patient was not present at the beginning of group.  He arrived after only 15 minutes had passed, and seemed to respond positively to the music.  At the end of group, he stated that the music had woken him up and made him feel better.  Type of Therapy:  Music Therapy   Participation Level:  Active  Participation Quality:  Attentive a  Affect:  Blunted  Cognitive:  Oriented  Insight:  Developing  Engagement in Therapy:  Engaged  Modes of Intervention:   Activity, Exploration  Ambrose MantleMareida Grossman-Orr, LCSW 01/18/2016

## 2016-01-18 NOTE — [facility] Counselor (Signed)
Clinical Social Work Note  2nd attempt was made to do Psychosocial Assessment with pt, who refused.  CSW explained the importance of gathering information in order to start making discharge plans, and he stated he did not feel it was necessary, turned his back and refused to engage further.  Ambrose MantleMareida Grossman-Orr, LCSW 01/18/2016, 2:21 PM

## 2016-01-19 LAB — HEMOGLOBIN A1C
HEMOGLOBIN A1C: 5.5 % (ref 4.8–5.6)
MEAN PLASMA GLUCOSE: 111 mg/dL

## 2016-01-19 LAB — PROLACTIN: PROLACTIN: 19 ng/mL — AB (ref 4.0–15.2)

## 2016-01-19 MED ORDER — NICOTINE POLACRILEX 2 MG MT GUM
2.0000 mg | CHEWING_GUM | OROMUCOSAL | Status: DC | PRN
Start: 2016-01-19 — End: 2016-01-20
  Administered 2016-01-19 – 2016-01-20 (×3): 2 mg via ORAL
  Filled 2016-01-19: qty 1
  Filled 2016-01-19: qty 5
  Filled 2016-01-19: qty 1

## 2016-01-19 NOTE — [facility] Counselor (Signed)
Adult Comprehensive Assessment  Patient ID: Jay Moreno, male   DOB: [birth year], 31 y.o.   MRN: [id number]  Information Source: Information source: Patient  Current Stressors:  Employment / Job issues: Unemployed Family Relationships: "You would have to ask my motherEngineer, petroleum / Lack of resources (include bankruptcy): No income Housing / Lack of housing: Homeless Substance abuse: Admits to using anything/everything "if I have money"  UDS positive for stimulants Bereavement / Loss: "My pops died 14 years ago-it was a heart attack-I came home and he was in a coma-sometimes I wonder if he is really dead."  Living/Environment/Situation:  Living Arrangements: Alone (In Mother's house that she is trying to sell) Living conditions (as described by patient or guardian): "It was good while it lasted" How long has patient lived in current situation?: Can't stay at mom's house any more What is atmosphere in current home: Temporary  Family History:  Marital status: Single Are you sexually active?: Yes ("If the opportunity arises") What is your sexual orientation?: straight Does patient have children?: No  Childhood History:  By whom was/is the patient raised?: Both parents Description of patient's relationship with caregiver when they were a child: Good  Patient's description of current relationship with people who raised him/her: Father deceased, unsure about mom-"You would have to ask her" Does patient have siblings?: No Did patient suffer any verbal/emotional/physical/sexual abuse as a child?:  ("Not that it would matter."-would not answer yes or no) Did patient suffer from severe childhood neglect?: No Has patient ever been sexually abused/assaulted/raped as an adolescent or adult?: No Witnessed domestic violence?: No Has patient been effected by domestic violence as an adult?: No  Education:  Highest grade of school patient has completed: 12 Currently a student?: No Learning  disability?: No  Employment/Work Situation:   Employment situation: Unemployed 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 What is the longest time patient has a held a job?: 3 years Where was the patient employed at that time?: [employer] Has patient ever been in the [employer]?: No Are There Guns or Other Weapons in Your Home?: No  Financial Resources:   Financial resources: No income Does patient have a Lawyer or guardian?: No  Alcohol/Substance Abuse:   What has been your use of drugs/alcohol within the last 12 months?: "I'l  take just about anything if I have the money for it." Alcohol/Substance Abuse Treatment Hx: Past Tx, Outpatient If yes, describe treatment: States he has been to rehab Has alcohol/substance abuse ever caused legal problems?: Yes  Social Support System:   Patient's Community Support System: Good Describe Community Support System: "God takes care of me." Type of faith/religion: "I'm a Scientist, product/process development" How does patient's faith help to cope with current illness?: "I am able to rise above."  Leisure/Recreation:   Leisure and Hobbies: play guitar, blow glass, paint, write  Strengths/Needs:   What things does the patient do well?: same as above In what areas does patient struggle / problems for patient: unable to identify  Discharge Plan:   Does patient have access to transportation?: Yes Will patient be returning to same living situation after discharge?: No Plan for living situation after discharge: Unsure, unconcerned Currently receiving community mental health services: No If no, would patient like referral for services when discharged?: No Does patient have financial barriers related to discharge medications?: No (States he will not take meds)  Summary/Recommendations:   Summary and Recommendations (  to be completed by the evaluator): Jay Moreno is a 31 YO Caucasian male diagnosed with  methamphetamine-induced psychosis, including delusions.  [facility] mother who was concerned about his disorganization and mood lability.  Jay Moreno admits to previous hospitalization about a year ago at [facility] under similar circumstances.  He states he has not followed up with services nor taken meds in the past, and that is the plan this time as well.  He is homeless, but unconcerned about his destination post d/c.  "I like to just get out and walk."  Jay Moreno can benefit from crises stabilization, medication management and therapeutic milieu.  Ida Rogueodney B Kerrick Miler. 01/19/2016

## 2016-01-19 NOTE — Progress Notes (Signed)
Patient denies SI, HI and AVH this shift.  Patient has been participating in groups and unit activities.    Assess patient for safety, offer medications as prescribed, engage patient in 1:1 staff talks.   Continue to monitor as prescribed.

## 2016-01-19 NOTE — Tx Team (Signed)
Interdisciplinary Treatment and Diagnostic Plan Update  01/19/2016 Time of Session: 8:48 AM  KENYATTA Moreno MRN: [id number]  Principal Diagnosis: Methamphetamine use disorder, severe, dependence ([place])  Secondary Diagnoses: Principal Problem:   Methamphetamine use disorder, severe, dependence ([place]) Active Problems:   Major depressive disorder, single episode, severe without psychotic features ([place])   Current Medications:  Current Facility-Administered Medications  Medication Dose Route Frequency Provider Last Rate Last Dose  . acetaminophen (TYLENOL) tablet 650 mg  650 mg Oral Q6H PRN Patrecia Pour, NP      . alum & mag hydroxide-simeth (MAALOX/MYLANTA) 200-200-20 MG/5ML suspension 30 mL  30 mL Oral Q4H PRN Patrecia Pour, NP      . gabapentin (NEURONTIN) capsule 100 mg  100 mg Oral TID Jenne Campus, MD   100 mg at 01/19/16 0809  . hydrOXYzine (ATARAX/VISTARIL) tablet 25 mg  25 mg Oral TID PRN Patrecia Pour, NP   25 mg at 01/18/16 2123  . LORazepam (ATIVAN) tablet 1 mg  1 mg Oral Q8H PRN Myer Peer Cobos, MD      . magnesium hydroxide (MILK OF MAGNESIA) suspension 30 mL  30 mL Oral Daily PRN Patrecia Pour, NP      . OLANZapine (ZYPREXA) tablet 10 mg  10 mg Oral QHS Jenne Campus, MD   10 mg at 01/18/16 2122  . traZODone (DESYREL) tablet 50 mg  50 mg Oral QHS PRN Jenne Campus, MD   50 mg at 01/18/16 2122    PTA Medications: Prescriptions Prior to Admission  Medication Sig Dispense Refill Last Dose  . cephALEXin (KEFLEX) 500 MG capsule Take 1 capsule (500 mg total) by mouth 4 (four) times daily. (Patient not taking: Reported on 01/16/2016) 40 capsule 0 Not Taking at Unknown time    Treatment Modalities: Medication Management, Group therapy, Case management,  1 to 1 session with clinician, Psychoeducation, Recreational therapy.   Physician Treatment Plan for Primary Diagnosis: Methamphetamine use disorder, severe, dependence ([place]) Long Term Goal(s): Improvement in  symptoms so as ready for discharge  Short Term Goals: Ability to identify changes in lifestyle to reduce recurrence of condition will improve Ability to maintain clinical measurements within normal limits will improve Compliance with prescribed medications will improve Ability to identify changes in lifestyle to reduce recurrence of condition will improve Compliance with prescribed medications will improve Ability to identify triggers associated with substance abuse/mental health issues will improve  Medication Management: Evaluate patient's response, side effects, and tolerance of medication regimen.  Therapeutic Interventions: 1 to 1 sessions, Unit Group sessions and Medication administration.  Evaluation of Outcomes: Progressing  Physician Treatment Plan for Secondary Diagnosis: Principal Problem:   Methamphetamine use disorder, severe, dependence ([place]) Active Problems:   Major depressive disorder, single episode, severe without psychotic features ([place])   Long Term Goal(s): Improvement in symptoms so as ready for discharge  Short Term Goals: Ability to identify changes in lifestyle to reduce recurrence of condition will improve Ability to maintain clinical measurements within normal limits will improve Compliance with prescribed medications will improve Ability to identify changes in lifestyle to reduce recurrence of condition will improve Compliance with prescribed medications will improve Ability to identify triggers associated with substance abuse/mental health issues will improve  Medication Management: Evaluate patient's response, side effects, and tolerance of medication regimen.  Therapeutic Interventions: 1 to 1 sessions, Unit Group sessions and Medication administration.  Evaluation of Outcomes: Progressing   RN Treatment Plan for Primary Diagnosis: Methamphetamine use disorder,  severe, dependence ([place]) Long Term Goal(s): Knowledge of disease and therapeutic regimen  to maintain health will improve  Short Term Goals: Ability to identify and develop effective coping behaviors will improve and Compliance with prescribed medications will improve  Medication Management: RN will administer medications as ordered by provider, will assess and evaluate patient's response and provide education to patient for prescribed medication. RN will report any adverse and/or side effects to prescribing provider.  Therapeutic Interventions: 1 on 1 counseling sessions, Psychoeducation, Medication administration, Evaluate responses to treatment, Monitor vital signs and CBGs as ordered, Perform/monitor CIWA, COWS, AIMS and Fall Risk screenings as ordered, Perform wound care treatments as ordered.  Evaluation of Outcomes: Progressing   LCSW Treatment Plan for Primary Diagnosis: Methamphetamine use disorder, severe, dependence ([place]) Long Term Goal(s): Safe transition to appropriate next level of care at discharge, Engage patient in therapeutic group addressing interpersonal concerns.  Short Term Goals: Engage patient in aftercare planning with referrals and resources  Therapeutic Interventions: Assess for all discharge needs, 1 to 1 time with Social worker, Explore available resources and support systems, Assess for adequacy in community support network, Educate family and significant other(s) on suicide prevention, Complete Psychosocial Assessment, Interpersonal group therapy.  Evaluation of Outcomes: Met  Declined follow up, states he is not sure where he will go when he leaves, but he is unconcerned about it.  "I have options"  Asked for a list of [facility] houses.  "I've stayed there before.  I know where some are."   Progress in Treatment: Attending groups: Yes Participating in groups: Yes Taking medication as prescribed: Yes Toleration medication: Yes, no side effects reported at this time Family/Significant other contact made: No Patient understands diagnosis: No  Limited  insightt Discussing patient identified problems/goals with staff: Yes Medical problems stabilized or resolved: Yes Denies suicidal/homicidal ideation: Yes Issues/concerns per patient self-inventory: None Other: N/A  New problem(s) identified: None identified at this time.   New Short Term/Long Term Goal(s): None identified at this time.   Discharge Plan or Barriers: see above  Reason for Continuation of Hospitalization: Disorganization Medication stabilization   Estimated Length of Stay: 1-2 days  Attendees: Patient: 01/19/2016  8:48 AM  Physician: Neita Garnet, MD 01/19/2016  8:48 AM  Nursing: Hoy Register, RN 01/19/2016  8:48 AM  RN Care Manager: Lars Pinks, RN 01/19/2016  8:48 AM  Social Worker: Ripley Fraise 01/19/2016  8:48 AM  Recreational Therapist: Laretta Bolster  01/19/2016  8:48 AM  Other: Ricky Ala 01/19/2016  8:48 AM  Other:  01/19/2016  8:48 AM    Scribe for Treatment Team:  Roque Lias LCSW 01/19/2016 8:48 AM

## 2016-01-19 NOTE — [facility] Suicide Risk Assessment (Signed)
[facility] INPATIENT:  Family/Significant Other Suicide Prevention Education  Suicide Prevention Education:  Patient Refusal for Family/Significant Other Suicide Prevention Education: The patient Jay Moreno has refused to provide written consent for family/significant other to be provided Family/Significant Other Suicide Prevention Education during admission and/or prior to discharge.  Physician notified.  Ida RogueRodney B Aleksandr Pellow 01/19/2016, 4:51 PM

## 2016-01-19 NOTE — [facility] Group Notes (Signed)
[facility] LCSW Group Therapy  01/19/2016 1:15 pm  Type of Therapy: Process Group Therapy  Participation Level:  Active  Participation Quality:  Appropriate  Affect:  Flat  Cognitive:  Oriented  Insight:  Improving  Engagement in Group:  Limited  Engagement in Therapy:  Limited  Modes of Intervention:  Activity, Clarification, Education, Problem-solving and Support  Summary of Progress/Problems: Today's group addressed the issue of overcoming obstacles.  Patients were asked to identify their biggest obstacle post d/c that stands in the way of their on-going success, and then problem solve as to how to manage this. Stayed the entire time, engaged throughout.  His responses to questions were abstract and esoteric-nothing concrete that was easy to build upon.  Stated that his biggest obstacle is "my stubbornness and my insecurities."  Went on to say he feels like he can put these to good use.  "I can apply my stubbornness to a positive goal for myself-I'm not sure what it would be right now, and also I can speak up about my insecurities like my friend did here because I can relate to that."  Ida Rogueorth, Rashonda Warrior B 01/19/2016   3:38 PM

## 2016-01-19 NOTE — Progress Notes (Signed)
Recreation Therapy Notes  INPATIENT RECREATION THERAPY ASSESSMENT  Patient Details Name: Jay Moreno C Kunkler MRN: [id number] DOB: 01/12/1985 Today's Date: 01/19/2016  Patient Stressors: Work, Other (Comment) (Trying to find a job; finances)  Pt stated he didn't know why he was here.  Coping Skills:   Avoidance, Exercise, Art/Dance, Talking, Music, Sports  Personal Challenges: Concentration, Decision-Making, Self-Esteem/Confidence, Social Interaction  Leisure Interests (2+):  Ashby DawesNature - Lawn care, Citigroupature - [employer] World[employer]Vegetable gardening, Individual - Writing  Awareness of Community Resources:  Yes  Community Resources:  [employer], Engineering geologistLibrary, Engineer, petroleumGym  Current Use: Yes  Patient Strengths:  Ambitious; Practical  Patient Identified Areas of Improvement:  "I don't have any"  Current Recreation Participation:  "Not often"  Patient Goal for Hospitalization:  "Figure out how to eat better, learn about my disability and [place]"  [place]  [place] Residence[place]  Current SI (including self-harm):  No  Current HI:  No  Consent to Intern Participation: N/A   Caroll RancherMarjette Singleton [place], LRT/CTRS  Caroll RancherLindsay, Analyce Tavares A 01/19/2016, 2:43 PM

## 2016-01-19 NOTE — Progress Notes (Signed)
Adult Psychoeducational Group Note  Date:  01/19/2016 Time:  9:54 PM  Group Topic/Focus:  Wrap-Up Group:   The focus of this group is to help patients review their daily goal of treatment and discuss progress on daily workbooks.   Participation Level:  Active  Participation Quality:  Appropriate  Affect:  Appropriate  Cognitive:  Alert  Insight: Appropriate  Engagement in Group:  Engaged  Modes of Intervention:  Discussion  Additional Comments: Patient states, "my day was alright until the visit with my mom". Patient's goal for today was to get better.  Sumiye Hirth L Trine Fread 01/19/2016, 9:54 PM
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
## 2016-01-19 NOTE — Progress Notes (Signed)
Recreation Therapy Notes  Date: 01/19/16 Time: 1000 Location: 500 Hall Dayroom  Group Topic: Wellness  Goal Area(s) Addresses:  Patient will define components of whole wellness. Patient will verbalize benefit of whole wellness.  Intervention:  [employer]Health[employer]ach ball, chairs  Activity:  Keep It ContractorGoing Volleyball.  Patients were to sit in a chair in a circle.  Patients were to pass the ball back and forth to each within the circle.  Patients could bounce the ball off of floor but it could not roll to a stop.  If the ball came to a stop LRT would start the count over.  Patients were trying to beat the previous record set by another group.    Education: Wellness, Building control surveyorDischarge Planning.   Education Outcome: Acknowledges education/In group clarification offered/Needs additional education.   Clinical Observations/Feedback:  Pt did not attend group.   Caroll RancherMarjette Dejanique Ruehl, LRT/CTRS         Caroll RancherLindsay, Nixxon Faria A 01/19/2016 1:03 PM

## 2016-01-20 DIAGNOSIS — F1099 Alcohol use, unspecified with unspecified alcohol-induced disorder: Secondary | ICD-10-CM

## 2016-01-20 MED ORDER — TRAZODONE HCL 50 MG PO TABS: 50.0000 mg | ORAL_TABLET | Freq: Every evening | ORAL | 0 refills | Status: DC | PRN

## 2016-01-20 MED ORDER — GABAPENTIN 100 MG PO CAPS: 100.0000 mg | ORAL_CAPSULE | Freq: Three times a day (TID) | ORAL | 0 refills | Status: DC

## 2016-01-20 MED ORDER — NICOTINE POLACRILEX 2 MG MT GUM: 2.0000 mg | CHEWING_GUM | OROMUCOSAL | 0 refills | Status: DC | PRN

## 2016-01-20 MED ORDER — HYDROXYZINE HCL 25 MG PO TABS: 25.0000 mg | ORAL_TABLET | Freq: Three times a day (TID) | ORAL | 0 refills | Status: DC | PRN

## 2016-01-20 MED ORDER — OLANZAPINE 10 MG PO TABS: 10.0000 mg | ORAL_TABLET | Freq: Every day | ORAL | 0 refills | Status: DC
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
## 2016-01-20 NOTE — Tx Team (Signed)
Interdisciplinary Treatment and Diagnostic Plan Update  01/20/2016 Time of Session: 2:15 PM  Jay Moreno MRN: [id number]  Principal Diagnosis: Methamphetamine use disorder, severe, dependence ([place])  Secondary Diagnoses: Principal Problem:   Methamphetamine use disorder, severe, dependence ([place]) Active Problems:   Major depressive disorder, single episode, severe without psychotic features ([place])   Current Medications:  Current Facility-Administered Medications  Medication Dose Route Frequency Provider Last Rate Last Dose  . acetaminophen (TYLENOL) tablet 650 mg  650 mg Oral Q6H PRN Patrecia Pour, NP      . alum & mag hydroxide-simeth (MAALOX/MYLANTA) 200-200-20 MG/5ML suspension 30 mL  30 mL Oral Q4H PRN Patrecia Pour, NP      . gabapentin (NEURONTIN) capsule 100 mg  100 mg Oral TID Jenne Campus, MD   100 mg at 01/20/16 1253  . hydrOXYzine (ATARAX/VISTARIL) tablet 25 mg  25 mg Oral TID PRN Patrecia Pour, NP   25 mg at 01/19/16 2108  . LORazepam (ATIVAN) tablet 1 mg  1 mg Oral Q8H PRN Myer Peer Cobos, MD      . magnesium hydroxide (MILK OF MAGNESIA) suspension 30 mL  30 mL Oral Daily PRN Patrecia Pour, NP      . nicotine polacrilex (NICORETTE) gum 2 mg  2 mg Oral PRN Ursula Alert, MD   2 mg at 01/20/16 1127  . OLANZapine (ZYPREXA) tablet 10 mg  10 mg Oral QHS Jenne Campus, MD   10 mg at 01/19/16 2108  . traZODone (DESYREL) tablet 50 mg  50 mg Oral QHS PRN Jenne Campus, MD   50 mg at 01/19/16 2108    PTA Medications: Prescriptions Prior to Admission  Medication Sig Dispense Refill Last Dose  . cephALEXin (KEFLEX) 500 MG capsule Take 1 capsule (500 mg total) by mouth 4 (four) times daily. (Patient not taking: Reported on 01/16/2016) 40 capsule 0 Not Taking at Unknown time    Treatment Modalities: Medication Management, Group therapy, Case management,  1 to 1 session with clinician, Psychoeducation, Recreational therapy.   Physician Treatment Plan for Primary  Diagnosis: Methamphetamine use disorder, severe, dependence ([place]) Long Term Goal(s): Improvement in symptoms so as ready for discharge  Short Term Goals: Ability to identify changes in lifestyle to reduce recurrence of condition will improve Ability to maintain clinical measurements within normal limits will improve Compliance with prescribed medications will improve Ability to identify changes in lifestyle to reduce recurrence of condition will improve Compliance with prescribed medications will improve Ability to identify triggers associated with substance abuse/mental health issues will improve  Medication Management: Evaluate patient's response, side effects, and tolerance of medication regimen.  Therapeutic Interventions: 1 to 1 sessions, Unit Group sessions and Medication administration.  Evaluation of Outcomes: Adequate for Discharge  Physician Treatment Plan for Secondary Diagnosis: Principal Problem:   Methamphetamine use disorder, severe, dependence ([place]) Active Problems:   Major depressive disorder, single episode, severe without psychotic features ([place])   Long Term Goal(s): Improvement in symptoms so as ready for discharge  Short Term Goals: Ability to identify changes in lifestyle to reduce recurrence of condition will improve Ability to maintain clinical measurements within normal limits will improve Compliance with prescribed medications will improve Ability to identify changes in lifestyle to reduce recurrence of condition will improve Compliance with prescribed medications will improve Ability to identify triggers associated with substance abuse/mental health issues will improve  Medication Management: Evaluate patient's response, side effects, and tolerance of medication regimen.  Therapeutic Interventions: 1  to 1 sessions, Unit Group sessions and Medication administration.  Evaluation of Outcomes: Adequate for Discharge   RN Treatment Plan for Primary  Diagnosis: Methamphetamine use disorder, severe, dependence ([place]) Long Term Goal(s): Knowledge of disease and therapeutic regimen to maintain health will improve  Short Term Goals: Ability to identify and develop effective coping behaviors will improve and Compliance with prescribed medications will improve  Medication Management: RN will administer medications as ordered by provider, will assess and evaluate patient's response and provide education to patient for prescribed medication. RN will report any adverse and/or side effects to prescribing provider.  Therapeutic Interventions: 1 on 1 counseling sessions, Psychoeducation, Medication administration, Evaluate responses to treatment, Monitor vital signs and CBGs as ordered, Perform/monitor CIWA, COWS, AIMS and Fall Risk screenings as ordered, Perform wound care treatments as ordered.  Evaluation of Outcomes: Adequate for Discharge   LCSW Treatment Plan for Primary Diagnosis: Methamphetamine use disorder, severe, dependence ([place]) Long Term Goal(s): Safe transition to appropriate next level of care at discharge, Engage patient in therapeutic group addressing interpersonal concerns.  Short Term Goals: Engage patient in aftercare planning with referrals and resources  Therapeutic Interventions: Assess for all discharge needs, 1 to 1 time with Social worker, Explore available resources and support systems, Assess for adequacy in community support network, Educate family and significant other(s) on suicide prevention, Complete Psychosocial Assessment, Interpersonal group therapy.  Evaluation of Outcomes: Met  Declined follow up, states he is not sure where he will go when he leaves, but he is unconcerned about it.  "I have options"  Asked for a list of [place] houses.  "I've stayed there before.  I know where some are." 1/16:  Today pt asked for a referral to [facility].  He has an appointment tomorrow for screening.  Stated he will probably go stay at  his mother's tonight, and get a ride from her tomorrow to his appointment   Progress in Treatment: Attending groups: Yes Participating in groups: Yes Taking medication as prescribed: Yes Toleration medication: Yes, no side effects reported at this time Family/Significant other contact made: No Patient understands diagnosis: No  Limited insightt Discussing patient identified problems/goals with staff: Yes Medical problems stabilized or resolved: Yes Denies suicidal/homicidal ideation: Yes Issues/concerns per patient self-inventory: None Other: N/A  New problem(s) identified: None identified at this time.   New Short Term/Long Term Goal(s): None identified at this time.   Discharge Plan or Barriers: see above  Reason for Continuation of Hospitalization:   Estimated Length of Stay: D/C today  Attendees: Patient: 01/20/2016  2:15 PM  Physician: Neita Garnet, MD 01/20/2016  2:15 PM  Nursing: Hoy Register, RN 01/20/2016  2:15 PM  RN Care Manager: Lars Pinks, RN 01/20/2016  2:15 PM  Social Worker: Ripley Fraise 01/20/2016  2:15 PM  Recreational Therapist: Laretta Bolster  01/20/2016  2:15 PM  Other: Ricky Ala 01/20/2016  2:15 PM  Other:  01/20/2016  2:15 PM    Scribe for Treatment Team:  Roque Lias LCSW 01/20/2016 2:15 PM

## 2016-01-20 NOTE — Progress Notes (Signed)
  [facility] :  Will you be returning to the same living situation after discharge:  No. At discharge, do you have transportation home?: Yes,  bus pass Do you have the ability to pay for your medications: Yes,  [name]  Release of information consent forms completed and in the chart;  Patient's signature needed at discharge.  Patient to Follow up at: Follow-up Information    [facility] Follow up on 01/21/2016.   Why:  Wednesday at 11:45 for your screening for admission appointment Contact information: [address] [place] [address] [phone]           Next level of care provider has [facility] to [facility] Link:no  Safety Planning and Suicide Prevention discussed: Yes,  yes  Have you used any form of tobacco in the last 30 days? (Cigarettes, Smokeless Tobacco, Cigars, and/or Pipes): Yes  Has patient been referred to the Quitline?: Patient refused referral  Patient has been referred for addiction treatment: Yes  Jay Moreno 01/20/2016, 2:14 PM
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
## 2016-01-20 NOTE — Progress Notes (Signed)
D: Pt denies SI/HI/AVH. Pt is pleasant and cooperative. Pt presents as if he is responding, but is very appropriate on the unit. Pt avoids interaction but will interact if engaged.   A: Pt was offered support and encouragement. Pt was given scheduled medications. Pt was encourage to attend groups. Q 15 minute checks were done for safety.   R:Pt attends groups and interacts well with peers and staff. Pt is taking medication. Pt has no complaints.Pt receptive to treatment and safety maintained on unit.

## 2016-01-20 NOTE — Progress Notes (Signed)
Recreation Therapy Notes  Date: 01/20/16 Time: 1000 Location: 500 Hall Dayroom  Group Topic: Coping Skills  Goal Area(s) Addresses:  Pt will be able to identify positive coping skills. Pt will be able to identify benefits of coping skills. Pt will be able to identify benefits of coping skills post d/c.  Intervention: Social workerMind Map worksheet, pencils  Activity: Coping Skills Mind Map.  LRT introduced the concept of coping skills.  Patients were given a worksheet with a mind map to develop coping skills.  LRT and patients filled out the first 8 squares together, identifying triggers that would lead to the use of coping skills.  For each trigger patients were to list at least 3 coping skills.  Education: PharmacologistCoping Skills, Building control surveyorDischarge Planning.   Education Outcome: Acknowledges understanding/In group clarification offered/Needs additional education.   Clinical Observations/Feedback: Pt did not attend group.    Caroll RancherMarjette Monik Lins, LRT/CTRS      Caroll RancherLindsay, Yamilet Mcfayden A 01/20/2016 12:14 PM
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
## 2022-08-27 ENCOUNTER — Encounter (HOSPITAL_COMMUNITY): Payer: Self-pay | Admitting: Emergency Medicine

## 2022-08-27 ENCOUNTER — Other Ambulatory Visit: Payer: Self-pay

## 2022-08-27 ENCOUNTER — Emergency Department (HOSPITAL_COMMUNITY): Payer: 59

## 2022-08-27 ENCOUNTER — Emergency Department (HOSPITAL_COMMUNITY)
Admission: EM | Admit: 2022-08-27 | Discharge: 2022-08-27 | Disposition: A | Discharge status: Home / Self Care | Payer: 59 | Attending: Emergency Medicine | Admitting: Emergency Medicine

## 2022-08-27 DIAGNOSIS — Y9351 Activity, roller skating (inline) and skateboarding: Secondary | ICD-10-CM | POA: Diagnosis not present

## 2022-08-27 DIAGNOSIS — M7989 Other specified soft tissue disorders: Secondary | ICD-10-CM | POA: Insufficient documentation

## 2022-08-27 DIAGNOSIS — S99912A Unspecified injury of left ankle, initial encounter: Secondary | ICD-10-CM | POA: Diagnosis present

## 2022-08-27 DIAGNOSIS — S93402A Sprain of unspecified ligament of left ankle, initial encounter: Secondary | ICD-10-CM | POA: Insufficient documentation

## 2022-08-27 MED ORDER — HYDROCODONE-ACETAMINOPHEN 5-325 MG PO TABS
1.0000 | ORAL_TABLET | Freq: Once | ORAL | Status: AC
  Administered 2022-08-27: 1 via ORAL
  Filled 2022-08-27: qty 1

## 2022-08-27 NOTE — ED Triage Notes (Signed)
Pt reports falling off of skateboard and twisting left ankle.

## 2022-08-27 NOTE — ED Notes (Signed)
Pt refused crutches.

## 2022-08-27 NOTE — ED Provider Notes (Signed)
[place] EMERGENCY DEPARTMENT AT [facility] Provider Note   CSN: [id number] Arrival date & time: 08/27/22  2003     History  Chief Complaint  Patient presents with   Ankle Pain    KAZIMIERZ RODI is a 37 y.o. male.  37 year old male with prior medical history as detailed below presents that he injured his left ankle while skateboarding.  He complains of significant pain to the lateral aspect of the left ankle.  Of note, on my initial evaluation the patient was lying on the floor.  He reports that he was more comfortable lying on the floor than being on the stretcher in a chair.  He denies any other injury.    The history is provided by the patient and medical records.       Home Medications Prior to Admission medications   Medication Sig Start Date End Date Taking? Authorizing Provider  gabapentin (NEURONTIN) 100 MG capsule Take 1 capsule (100 mg total) by mouth 3 (three) times daily. 01/20/16   Oneta Rack, NP  hydrOXYzine (ATARAX/VISTARIL) 25 MG tablet Take 1 tablet (25 mg total) by mouth 3 (three) times daily as needed for anxiety. 01/20/16   Oneta Rack, NP  nicotine polacrilex (NICORETTE) 2 MG gum Take 1 each (2 mg total) by mouth as needed for smoking cessation. 01/20/16   Oneta Rack, NP  OLANZapine (ZYPREXA) 10 MG tablet Take 1 tablet (10 mg total) by mouth at bedtime. 01/20/16   Oneta Rack, NP  traZODone (DESYREL) 50 MG tablet Take 1 tablet (50 mg total) by mouth at bedtime as needed for sleep. 01/20/16   Oneta Rack, NP      Allergies    Patient has no known allergies.    Review of Systems   Review of Systems  All other systems reviewed and are negative.   Physical Exam Updated Vital Signs BP (!) 173/96 (BP Location: Right Arm)   Pulse (!) 104   Temp 98.1 F (36.7 C) (Oral)   Resp 18   Ht 5' 11.5" (1.816 m)   Wt 104.3 kg   SpO2 98%   BMI 31.63 kg/m  Physical Exam Vitals and nursing note reviewed.  Constitutional:       General: He is not in acute distress.    Appearance: Normal appearance. He is well-developed.  HENT:     Head: Normocephalic and atraumatic.  Eyes:     Conjunctiva/sclera: Conjunctivae normal.     Pupils: Pupils are equal, round, and reactive to light.  Cardiovascular:     Rate and Rhythm: Normal rate and regular rhythm.     Heart sounds: Normal heart sounds.  Pulmonary:     Effort: Pulmonary effort is normal. No respiratory distress.     Breath sounds: Normal breath sounds.  Abdominal:     General: There is no distension.     Palpations: Abdomen is soft.     Tenderness: There is no abdominal tenderness.  Musculoskeletal:        General: Tenderness present. No deformity. Normal range of motion.     Cervical back: Normal range of motion and neck supple.     Comments: Mild tenderness to the left lateral aspect of the left ankle.  No significant edema.  Active range of motion limited by pain.  Distal left lower extremity is neurovascular intact.  Skin:    General: Skin is warm and dry.  Neurological:     General: No focal deficit  present.     Mental Status: He is alert and oriented to person, place, and time.     ED Results / Procedures / Treatments   Labs (all labs ordered are listed, but only abnormal results are displayed) Labs Reviewed - No data to display  EKG None  Radiology DG Foot Complete Left  Result Date: 08/27/2022 CLINICAL DATA:  Fall off skateboard twisting left ankle. Left foot and ankle pain. EXAM: LEFT FOOT - COMPLETE 3+ VIEW; LEFT ANKLE COMPLETE - 3+ VIEW COMPARISON:  None Available. FINDINGS: Foot: There is no evidence of fracture or dislocation. There is no evidence of arthropathy or other focal bone abnormality. Soft tissues are unremarkable. Ankle: No acute fracture or dislocation. The ankle mortise is preserved. No ankle joint effusion. No erosive change or focal bone abnormality. Generalized soft tissue edema. IMPRESSION: Generalized soft tissue  edema. No fracture or dislocation of the left foot or ankle. Electronically Signed   By: Narda Rutherford M.D.   On: 08/27/2022 21:16   DG Ankle Complete Left  Result Date: 08/27/2022 CLINICAL DATA:  Fall off skateboard twisting left ankle. Left foot and ankle pain. EXAM: LEFT FOOT - COMPLETE 3+ VIEW; LEFT ANKLE COMPLETE - 3+ VIEW COMPARISON:  None Available. FINDINGS: Foot: There is no evidence of fracture or dislocation. There is no evidence of arthropathy or other focal bone abnormality. Soft tissues are unremarkable. Ankle: No acute fracture or dislocation. The ankle mortise is preserved. No ankle joint effusion. No erosive change or focal bone abnormality. Generalized soft tissue edema. IMPRESSION: Generalized soft tissue edema. No fracture or dislocation of the left foot or ankle. Electronically Signed   By: Narda Rutherford M.D.   On: 08/27/2022 21:16    Procedures Procedures    Medications Ordered in ED Medications  HYDROcodone-acetaminophen (NORCO/VICODIN) 5-325 MG per tablet 1 tablet (1 tablet Oral Given 08/27/22 2041)    ED Course/ Medical Decision Making/ A&P                                 Medical Decision Making Amount and/or Complexity of Data Reviewed Radiology: ordered.  Risk Prescription drug management.    Medical Screen Complete  This patient presented to the ED with complaint of left ankle injury.  This complaint involves an extensive number of treatment options. The initial differential diagnosis includes, but is not limited to, sprain, fracture  This presentation is:   , Acute, Self-Limited, and Previously Undiagnosed  Patient presents with complaint of left ankle injury after falling off a skateboard.  Imaging does not reveal evidence of fracture.  Patient instructed on how to manage his injury in the outpatient setting.  Importance of close follow-up stressed.  Strict return precautions given and understood.  Additional history obtained:  External  records from outside sources obtained and reviewed including prior ED visits and prior Inpatient records.    Problem List / ED Course:  Left ankle sprain   Reevaluation:  After the interventions noted above, I reevaluated the patient and found that they have: improved  Disposition:  After consideration of the diagnostic results and the patients response to treatment, I feel that the patent would benefit from close outpatient follow-up.          Final Clinical Impression(s) / ED Diagnoses Final diagnoses:  Sprain of left ankle, unspecified ligament, initial encounter    Rx / DC Orders ED Discharge Orders     None  Wynetta Fines, MD 08/27/22 2127

## 2022-08-27 NOTE — Discharge Instructions (Addendum)
Return for any problem.    X-rays obtained today are without evidence of broken bones.  Ace wrap will help alleviate your pain.  Ice and elevate your ankle as instructed.    Use crutches as necessary for pain.

## 2022-09-21 ENCOUNTER — Ambulatory Visit (HOSPITAL_COMMUNITY)
Admission: EM | Admit: 2022-09-21 | Discharge: 2022-09-21 | Disposition: A | Discharge status: Home / Self Care | Payer: Self-pay

## 2022-09-21 NOTE — ED Notes (Signed)
Pt left AMA °

## 2022-09-23 ENCOUNTER — Encounter (HOSPITAL_COMMUNITY): Payer: Self-pay | Admitting: Family Medicine

## 2022-09-23 ENCOUNTER — Ambulatory Visit (HOSPITAL_COMMUNITY)
Admission: EM | Admit: 2022-09-23 | Discharge: 2022-09-23 | Disposition: A | Discharge status: Other Acute Inpatient Hospital | Payer: No Payment, Other | Attending: Psychiatry | Admitting: Psychiatry

## 2022-09-23 ENCOUNTER — Other Ambulatory Visit (HOSPITAL_COMMUNITY)
Admission: EM | Admit: 2022-09-23 | Discharge: 2022-09-24 | Disposition: A | Discharge status: Home / Self Care | Payer: No Payment, Other | Source: Home / Self Care | Admitting: Family Medicine

## 2022-09-23 DIAGNOSIS — M549 Dorsalgia, unspecified: Secondary | ICD-10-CM | POA: Insufficient documentation

## 2022-09-23 DIAGNOSIS — F1990 Other psychoactive substance use, unspecified, uncomplicated: Secondary | ICD-10-CM

## 2022-09-23 DIAGNOSIS — R45851 Suicidal ideations: Secondary | ICD-10-CM | POA: Diagnosis not present

## 2022-09-23 DIAGNOSIS — F15259 Other stimulant dependence with stimulant-induced psychotic disorder, unspecified: Secondary | ICD-10-CM | POA: Diagnosis not present

## 2022-09-23 DIAGNOSIS — F331 Major depressive disorder, recurrent, moderate: Secondary | ICD-10-CM | POA: Diagnosis not present

## 2022-09-23 DIAGNOSIS — F1594 Other stimulant use, unspecified with stimulant-induced mood disorder: Secondary | ICD-10-CM

## 2022-09-23 LAB — CBC WITH DIFFERENTIAL/PLATELET
Abs Immature Granulocytes: 0.03 10*3/uL (ref 0.00–0.07)
Basophils Absolute: 0 10*3/uL (ref 0.0–0.1)
Basophils Relative: 0 %
Eosinophils Absolute: 0.1 10*3/uL (ref 0.0–0.5)
Eosinophils Relative: 1 %
HCT: 42.9 % (ref 39.0–52.0)
Hemoglobin: 14.4 g/dL (ref 13.0–17.0)
Immature Granulocytes: 0 %
Lymphocytes Relative: 10 %
Lymphs Abs: 0.8 10*3/uL (ref 0.7–4.0)
MCH: 29 pg (ref 26.0–34.0)
MCHC: 33.6 g/dL (ref 30.0–36.0)
MCV: 86.3 fL (ref 80.0–100.0)
Monocytes Absolute: 0.6 10*3/uL (ref 0.1–1.0)
Monocytes Relative: 7 %
Neutro Abs: 7 10*3/uL (ref 1.7–7.7)
Neutrophils Relative %: 82 %
Platelets: 376 10*3/uL (ref 150–400)
RBC: 4.97 MIL/uL (ref 4.22–5.81)
RDW: 12.1 % (ref 11.5–15.5)
WBC: 8.5 10*3/uL (ref 4.0–10.5)
nRBC: 0 % (ref 0.0–0.2)

## 2022-09-23 LAB — COMPREHENSIVE METABOLIC PANEL
ALT: 36 U/L (ref 0–44)
AST: 26 U/L (ref 15–41)
Albumin: 4.1 g/dL (ref 3.5–5.0)
Alkaline Phosphatase: 73 U/L (ref 38–126)
Anion gap: 12 (ref 5–15)
BUN: 13 mg/dL (ref 6–20)
CO2: 24 mmol/L (ref 22–32)
Calcium: 9.3 mg/dL (ref 8.9–10.3)
Chloride: 99 mmol/L (ref 98–111)
Creatinine, Ser: 0.87 mg/dL (ref 0.61–1.24)
GFR, Estimated: >60 mL/min (ref >60)
Glucose, Bld: 117 mg/dL — ABNORMAL HIGH (ref 70–99)
Potassium: 3.9 mmol/L (ref 3.5–5.1)
Sodium: 135 mmol/L (ref 135–145)
Total Bilirubin: 0.6 mg/dL (ref 0.3–1.2)
Total Protein: 7 g/dL (ref 6.5–8.1)

## 2022-09-23 LAB — LIPID PANEL
Cholesterol: 157 mg/dL (ref 0–200)
HDL: 59 mg/dL (ref >40)
LDL Cholesterol: 87 mg/dL (ref 0–99)
Total CHOL/HDL Ratio: 2.7 RATIO
Triglycerides: 55 mg/dL (ref <150)
VLDL: 11 mg/dL (ref 0–40)

## 2022-09-23 LAB — POCT URINE DRUG SCREEN - MANUAL ENTRY (I-SCREEN)
POC Amphetamine UR: POSITIVE — AB
POC Buprenorphine (BUP): NOT DETECTED
POC Cocaine UR: NOT DETECTED
POC Marijuana UR: NOT DETECTED — AB
POC Methadone UR: NOT DETECTED
POC Methamphetamine UR: POSITIVE — AB
POC Morphine: NOT DETECTED
POC Oxazepam (BZO): NOT DETECTED
POC Oxycodone UR: NOT DETECTED
POC Secobarbital (BAR): NOT DETECTED

## 2022-09-23 LAB — TSH: TSH: 0.182 u[IU]/mL — ABNORMAL LOW (ref 0.350–4.500)

## 2022-09-23 LAB — HEMOGLOBIN A1C
Hgb A1c MFr Bld: 5.9 % — ABNORMAL HIGH (ref 4.8–5.6)
Mean Plasma Glucose: 122.63 mg/dL

## 2022-09-23 LAB — ETHANOL: Alcohol, Ethyl (B): <10 mg/dL (ref <10)

## 2022-09-23 MED ORDER — MAGNESIUM HYDROXIDE 400 MG/5ML PO SUSP: 30.0000 mL | Freq: Every day | ORAL | Status: DC | PRN

## 2022-09-23 MED ORDER — DICYCLOMINE HCL 20 MG PO TABS: 20.0000 mg | ORAL_TABLET | Freq: Four times a day (QID) | ORAL | Status: DC | PRN

## 2022-09-23 MED ORDER — NAPROXEN 500 MG PO TABS: 500.0000 mg | ORAL_TABLET | Freq: Two times a day (BID) | ORAL | Status: DC | PRN

## 2022-09-23 MED ORDER — METHOCARBAMOL 500 MG PO TABS: 500.0000 mg | ORAL_TABLET | Freq: Three times a day (TID) | ORAL | Status: DC | PRN

## 2022-09-23 MED ORDER — CLONIDINE HCL 0.1 MG PO TABS: 0.1000 mg | ORAL_TABLET | ORAL | Status: DC

## 2022-09-23 MED ORDER — LOPERAMIDE HCL 2 MG PO CAPS: 2.0000 mg | ORAL_CAPSULE | ORAL | Status: DC | PRN

## 2022-09-23 MED ORDER — NICOTINE 14 MG/24HR TD PT24
14.0000 mg | MEDICATED_PATCH | Freq: Every day | TRANSDERMAL | Status: DC
  Administered 2022-09-24: 14 mg via TRANSDERMAL
  Filled 2022-09-23: qty 1

## 2022-09-23 MED ORDER — METHOCARBAMOL 500 MG PO TABS
500.0000 mg | ORAL_TABLET | Freq: Three times a day (TID) | ORAL | Status: DC | PRN
  Administered 2022-09-23 – 2022-09-24 (×2): 500 mg via ORAL
  Filled 2022-09-23 (×3): qty 1

## 2022-09-23 MED ORDER — NICOTINE 21 MG/24HR TD PT24: 21.0000 mg | MEDICATED_PATCH | Freq: Every day | TRANSDERMAL | Status: DC

## 2022-09-23 MED ORDER — NICOTINE POLACRILEX 2 MG MT GUM
2.0000 mg | CHEWING_GUM | Freq: Four times a day (QID) | OROMUCOSAL | Status: DC | PRN
  Administered 2022-09-23 – 2022-09-24 (×2): 2 mg via ORAL
  Filled 2022-09-23 (×2): qty 1

## 2022-09-23 MED ORDER — NAPROXEN 500 MG PO TABS
500.0000 mg | ORAL_TABLET | Freq: Two times a day (BID) | ORAL | Status: DC | PRN
  Administered 2022-09-23 – 2022-09-24 (×2): 500 mg via ORAL
  Filled 2022-09-23 (×2): qty 1

## 2022-09-23 MED ORDER — ONDANSETRON 4 MG PO TBDP: 4.0000 mg | ORAL_TABLET | Freq: Four times a day (QID) | ORAL | Status: DC | PRN

## 2022-09-23 MED ORDER — CLONIDINE HCL 0.1 MG PO TABS: 0.1000 mg | ORAL_TABLET | Freq: Every day | ORAL | Status: DC

## 2022-09-23 MED ORDER — ACETAMINOPHEN 325 MG PO TABS: 650.0000 mg | ORAL_TABLET | Freq: Four times a day (QID) | ORAL | Status: DC | PRN

## 2022-09-23 MED ORDER — HYDROXYZINE HCL 25 MG PO TABS
25.0000 mg | ORAL_TABLET | Freq: Four times a day (QID) | ORAL | Status: DC | PRN
  Administered 2022-09-24: 25 mg via ORAL
  Filled 2022-09-23: qty 1

## 2022-09-23 MED ORDER — CLONIDINE HCL 0.1 MG PO TABS: 0.1000 mg | ORAL_TABLET | Freq: Four times a day (QID) | ORAL | Status: DC

## 2022-09-23 MED ORDER — OLANZAPINE 5 MG PO TBDP
5.0000 mg | ORAL_TABLET | Freq: Once | ORAL | Status: AC
  Administered 2022-09-23: 5 mg via ORAL
  Filled 2022-09-23: qty 1

## 2022-09-23 MED ORDER — CLONIDINE HCL 0.1 MG PO TABS
0.1000 mg | ORAL_TABLET | Freq: Once | ORAL | Status: AC
  Administered 2022-09-23: 0.1 mg via ORAL
  Filled 2022-09-23: qty 1

## 2022-09-23 MED ORDER — ALUM & MAG HYDROXIDE-SIMETH 200-200-20 MG/5ML PO SUSP: 30.0000 mL | ORAL | Status: DC | PRN

## 2022-09-23 MED ORDER — TRAZODONE HCL 50 MG PO TABS: 50.0000 mg | ORAL_TABLET | Freq: Every evening | ORAL | Status: DC | PRN

## 2022-09-23 MED ORDER — ONDANSETRON 4 MG PO TBDP
4.0000 mg | ORAL_TABLET | Freq: Four times a day (QID) | ORAL | Status: DC | PRN
  Administered 2022-09-24: 4 mg via ORAL
  Filled 2022-09-23: qty 1

## 2022-09-23 MED ORDER — HYDROXYZINE HCL 25 MG PO TABS: 25.0000 mg | ORAL_TABLET | Freq: Three times a day (TID) | ORAL | Status: DC | PRN

## 2022-09-23 MED ORDER — TRAZODONE HCL 50 MG PO TABS
50.0000 mg | ORAL_TABLET | Freq: Every evening | ORAL | Status: DC | PRN
  Administered 2022-09-23: 50 mg via ORAL
  Filled 2022-09-23: qty 1
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
## 2022-09-23 NOTE — Progress Notes (Signed)
   09/23/22 1324  [facility] Triage Screening (Walk-ins at [facility] only)  How Did You Hear About [place]? Family/Friend  What Is the Reason for Your Visit/Call Today? Pt presents voluntarily to [facility] accompanied by his child's mother. Pt states that he wants to get into detox from meth and fentanyl. Pt states that he has spoken to someone at [facility] and they told him to detox first before coming there. Pt denies SI, HI, AVH and alcohol use at this current time. Pt states that he used about a quarter to a half gram of meth this morning around 8-9. Pt states that he was seeing the family therapist at work but hasn't in about a month.  How Long Has This Been Causing You Problems? > than 6 months  Have You Recently Had Any Thoughts About Hurting Yourself? No  Are You Planning to Commit Suicide/Harm Yourself At This time? No  Have you Recently Had Thoughts About Hurting Someone Karolee Ohs? No  Are You Planning To Harm Someone At This Time? No  Are you currently experiencing any auditory, visual or other hallucinations? No  Have You Used Any Alcohol or Drugs in the Past 24 Hours? Yes  How long ago did you use Drugs or Alcohol? this morning around 8-9  What Did You Use and How Much? meth (quarter or half a gram)  Do you have any current medical co-morbidities that require immediate attention? No  Clinician description of patient physical appearance/behavior: groomed, calm, cooperative  What Do You Feel Would Help You the Most Today? Alcohol or Drug Use Treatment;Medication(s);Social Support  If access to [facility] Urgent Care was not available, would you have sought care in the Emergency Department? No  Determination of Need Routine (7 days)  Options For Referral Facility-Based Crisis;Medication Management;Outpatient Therapy

## 2022-09-23 NOTE — ED Notes (Signed)
Patient observed/assessed in patient room lying down asleep in bed. Patient aroused with verbal stimulation after several attempts to which patient responded in an agitated/annoyed demeanor. Affect is flat/agitated and eye contact is minimal. Patient evasive to preliminary assessment questions. Denies S/I and H/I. Denies A/V/H. Will continue to attempt to encourage patient to participate within the community and not solely isolate in room. Will continue to monitor for safety.

## 2022-09-23 NOTE — ED Provider Notes (Signed)
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 Lucianne Muss; and chart reviewed on 09/23/22.   HPI: Jay Moreno is a 37 year old male, with history of MDD, Methamphetamine use current daily, polysubstance use (intermittently over time Fentanyl, Heroin), ETOH use, presents today voluntarily, accompanied by significant other Shanda Bumps requesting detox with a plan to be transferred to [facility] for long-term treatment of recurrent methamphetamine use disorder. He last used around 8 AM this morning however reports that he has used for several hours throughout last evening.    He reports sleeping on average 3 hours within the last 24 hours.  He endorses increased alcohol consumption due to use of drugs.  He reports a few days ago he did trial fentanyl ingestion. Denies any history of recurrent IV drug use although endorses he had sampled Heroin intravenously sometime in the distant past.  Patient reports that he was  initially introduction to illicit substances at the age of 29.  He has used drugs intermittently with various degree of frequency throughout the years.  Prior to April 2024 he reports that he had used substances less frequently and 2 months ago he had a break-up with his significant other and this caused him to emotionally spiral out of control and he has been using methamphetamines daily drinking daily and subsequently stopped going to work.  He reports that he did not put in his FMLA paperwork and is unclear of the status of his employment at [employer].  He denies suicidal homicidal ideations.  He endorses at times he hears voices and endorses increased  paranoia but feels this is likely related to his substance use.  He reports during his admission to behavioral health inpatient back in 2018 that he was diagnosed with bipolar and discharged with medications however he never excepted diagnosis and did not take medications. He reports poor sleep quality due to binging on methamphetamines, but when he doesn't use meth daily he routinely is able to sleep without difficulty.  Denies any prior or recent attempts of suicide or self harm. Endorses only self harming behaviors is active substance use which he is aware is causing him problems.  During evaluation GUS DICKS is sitting upright in no acute distress.  He  is alert, oriented x 4, calm, cooperative and attentive.  His mood is euthymic with congruent affect.  He has normal speech, and behavior.  Objectively there is no evidence of psychosis/mania or delusional thinking.  Patient is able to converse coherently, goal directed thoughts, no distractibility, or pre-occupation. He also denies suicidal/self-harm/homicidal ideation, psychosis, and paranoia.  Patient answered question appropriately.       PHQ 2-9:   Flowsheet Row ED from 09/23/2022 in [facility] ED from 08/27/2022 in [facility] Emergency Department at [facility]  C-SSRS RISK CATEGORY No Risk No Risk       Total Time spent with patient: 45 minutes  Musculoskeletal  Strength & Muscle Tone: within normal limits Gait & Station: normal Patient leans: N/A  Psychiatric Specialty Exam  Presentation General Appearance: Appropriate for Environment  Eye Contact:Good  Speech:Clear and Coherent  Speech Volume:Normal  Handedness:Right   Mood and  Affect  Mood:Euthymic  Affect:Appropriate   Thought Process  Thought Processes:Coherent  Descriptions of Associations:Intact  Orientation:Full (Time, Place and Person)  Thought Content:Logical    Hallucinations:Hallucinations:  None  Ideas of Reference:None  Suicidal Thoughts:Suicidal Thoughts: No  Homicidal Thoughts:Homicidal Thoughts: No   Sensorium  Memory:Immediate Good; Recent Good; Remote Good  Judgment:Good  Insight:Good   Executive Functions  Concentration:Good  Attention Span:Fair  Recall:Good  Fund of Knowledge:Good  Language:Good   Psychomotor Activity  Psychomotor Activity:No data recorded  Assets  Assets:Communication Skills; Physical Health; Housing; Social Support; Desire for Improvement   Sleep  Sleep:Sleep: Poor Number of Hours of Sleep: 3   Nutritional Assessment (For OBS and FBC admissions only) Has the patient had a weight loss or gain of 10 pounds or more in the last 3 months?: No Has the patient had a decrease in food intake/or appetite?: No Does the patient have dental problems?: No Does the patient have eating habits or behaviors that may be indicators of an eating disorder including binging or inducing vomiting?: No Has the patient recently lost weight without trying?: 0 Has the patient been eating poorly because of a decreased appetite?: 0 Malnutrition Screening Tool Score: 0    Physical Exam Vitals reviewed.  Constitutional:      Appearance: Normal appearance.  HENT:     Head: Normocephalic and atraumatic.  Eyes:     Extraocular Movements: Extraocular movements intact.     Conjunctiva/sclera: Conjunctivae normal.     Pupils: Pupils are equal, round, and reactive to light.  Cardiovascular:     Rate and Rhythm: Normal rate and regular rhythm.  Pulmonary:     Effort: Pulmonary effort is normal.     Breath sounds: Normal breath sounds.  Musculoskeletal:        General: Normal range of motion.     Cervical back: Normal range of motion and neck supple.  Skin:    Capillary Refill: Capillary refill takes less than 2 seconds.  Neurological:     General: No focal deficit present.     Mental Status: He is alert.     Review of Systems   Psychiatric/Behavioral:  Positive for substance abuse. Negative for hallucinations, memory loss and suicidal ideas. The patient has insomnia. The patient is not nervous/anxious.     Blood pressure (!) 157/103, pulse 97, temperature 98.5 F (36.9 C), temperature source Oral, resp. rate 20, SpO2 100%. There is no height or weight on file to calculate BMI.  Past Psychiatric History: See HPI   Is the patient at risk to self? No  Has the patient been a risk to self in the past 6 months? No .    Has the patient been a risk to self within the distant past? No   Is the patient a risk to others? No   Has the patient been a risk to others in the past 6 months? No   Has the patient been a risk to others within the distant past? No   Past Medical History: See HPI Family History: No pertinent family history reported  Social History: Currently lives alone. Employed by Valorie Roosevelt and Elsie Lincoln. Recent break-up (X 2 month ago) with girlfriend who is the mother of his child, who is also a recovering addict.   Last Labs:  Admission on 09/23/2022  Component Date Value Ref Range Status   POC Amphetamine UR 09/23/2022 Positive (A)  NONE DETECTED (Cut Off Level 1000 ng/mL) Final   POC Secobarbital (BAR) 09/23/2022 None Detected  NONE DETECTED (Cut Off Level 300 ng/mL) Final   POC Buprenorphine (BUP) 09/23/2022 None Detected  NONE DETECTED (Cut Off Level 10 ng/mL) Final   POC Oxazepam (BZO) 09/23/2022 None Detected  NONE DETECTED (Cut Off Level 300 ng/mL) Final   POC Cocaine UR 09/23/2022 None Detected  NONE DETECTED (Cut Off Level 300 ng/mL) Final   POC Methamphetamine UR 09/23/2022 Positive (A)  NONE DETECTED (Cut Off Level 1000 ng/mL) Final   POC Morphine 09/23/2022 None Detected  NONE DETECTED (Cut Off Level 300 ng/mL) Final   POC Methadone UR 09/23/2022 None Detected  NONE DETECTED (Cut Off Level 300 ng/mL) Final   POC Oxycodone UR 09/23/2022 None Detected  NONE DETECTED (Cut Off Level 100 ng/mL) Final    POC Marijuana UR 09/23/2022 None Detected (A)  NONE DETECTED (Cut Off Level 50 ng/mL) Final  Labs Per [facility] CPE 04/09/22 WBC 7.1 4.0-11.0 K/ul    RBC 4.81 4.20-5.80 M/uL    HGB 13.9 13.0-17.0 g/dL    HCT 95.2 84.1-32.4 %    MCV 86.3 80.0-94.0 fL    MCH 29.0 27.0-33.0 pg    MCHC 33.6 32.0-36.0 g/dL    RDW 40.1 02.7-25.3 %    PLT 330 150-400 K/uL    MPV 8.4 7.5-10.7 fL    NE% 58.4 43.3-71.9 %    LY% 26.3 16.8-43.5 %    MO% 10.7 4.6-12.4 %    EO% 4.1 0.0-7.8 %    BA% 0.5 0.0-1.0 %    NE# 4.2 1.9-7.2 K/uL    LY# 1.90 1.10-2.70 K/uL    MO# 0.8 0.3-0.8 K/uL    EO# 0.3 0.0-0.6 K/uL    BA# 0.0 0.0-0.1 K/uL    NRBC% 0.10      NRBC# 0.00      Comp Metabolic Panel Reviewed date:04/09/2022 03:27:29 PM Interpretation:normal Performing Lab: Notes/Report: Testing Performed at: [facility], [address], [address], [place], [place] [address]  Glucose 91 70-99 mg/dL    BUN 14 3-47 mg/dL    Creatinine 4.25 9.56-3.87 mg/dl    [id number] 97 >18 calc In accordance with recommendations from NKF-ASN Task Force, [facility] has updated its eGFR calc to the 2021 CKD-EDI equation that estimates kidney function without a race variable;Stage 1 > 90 ML/Min plus Albuminuria;Stage 2 60-89 ML/MIN;Stage 3 30-59 ML/MIN;Stage 4 15-29 ML/MIN;Stage 5 <15 ML/MIN  Sodium 139 136-145 mmol/L    Potassium 4.6 3.5-5.5 mmol/L    Chloride 105 98-107 mmol/L    CO2 29 22-32 mmol/L    Anion Gap 9.3 6.0-20.0 mmol/L    Calcium 9.4 8.6-10.3 mg/dL    CA-corrected 8.41 6.60-63.01 mg/dL    Protein, Total 6.9 6.0-1.0 g/dL    Albumin 4.4 9.3-2.3 g/dL    TBIL 0.4 5.5-7.3 mg/dL    ALP 71 22-025 U/L    AST 26 0-39 U/L    ALT 43 0-52 U/L    Lipid Panel w/reflex Reviewed date:04/09/2022 03:27:20 PM Interpretation:LDL 96 Performing Lab: Notes/Report: Testing Performed at: [facility], [address], [place], [place] [address]  Cholesterol 167 <200 mg/dL    CHOL/HDL 3.7 2.3-7.6  Ratio    HDLD 45 30-70 mg/dL Values below 40 mg/dL indicate increased risk factor  Triglyceride 145 0-199 mg/dL    NHDL 283 1-517 mg/dL Range dependent upon risk factors.  LDL Chol Calc (NIH) 96 0-99 mg/dL     Allergies: Patient has no known allergies.  Medications:  Facility Ordered Medications  Medication   acetaminophen (  TYLENOL) tablet 650 mg   alum & mag hydroxide-simeth (MAALOX/MYLANTA) 200-200-20 MG/5ML suspension 30 mL   magnesium hydroxide (MILK OF MAGNESIA) suspension 30 mL   hydrOXYzine (ATARAX) tablet 25 mg   traZODone (DESYREL) tablet 50 mg   [START ON 09/24/2022] nicotine (NICODERM CQ - dosed in mg/24 hours) patch 21 mg   dicyclomine (BENTYL) tablet 20 mg   loperamide (IMODIUM) capsule 2-4 mg   methocarbamol (ROBAXIN) tablet 500 mg   naproxen (NAPROSYN) tablet 500 mg   ondansetron (ZOFRAN-ODT) disintegrating tablet 4 mg   cloNIDine (CATAPRES) tablet 0.1 mg   Followed by   Melene Muller ON 09/26/2022] cloNIDine (CATAPRES) tablet 0.1 mg   Followed by   Melene Muller ON 09/28/2022] cloNIDine (CATAPRES) tablet 0.1 mg    Long Term Goals: Improvement in symptoms so as ready for discharge and plan is to transfer to [facility]   Short Term Goals: Patient will verbalize feelings in meetings with treatment team members., Patient will attend at least of 50% of the groups daily., Pt will complete the PHQ9 on admission, day 3 and discharge., Patient will participate in completing the [place] Suicide Severity Rating Scale, Patient will score a low risk of violence for 24 hours prior to discharge, and Patient will take medications as prescribed daily.  Medical Decision Making  Methamphetamine -induced psychotic disorder with moderate to severe use  -Admit to [facility] -Patient is medically cleared for direct admission to [facility]. Per [facility], patient has had a recent physical and routine labs all of which are unremarkable and he currently has no chronic medical conditions.    -Clonidine protocol initiated  Recommendations  Based on my evaluation the patient does not appear to have an emergency medical condition.  Joaquin Courts, NP-C 09/23/22  3:33 PM

## 2022-09-23 NOTE — Progress Notes (Addendum)
Pt requested for discharge before the admission process was completed. Pt stated "this is not for me.I cannot stay here three to five days." Admission and length of stay was explained to pt but he was adamant on being discharge. Cala Bradford, NP was notified of pt's request. Writer was instructed by NP to administer Clonidine, Zyprexa and Robaxin for withdrawal. Medications were administered per order. NP came and assessed pt. NP informed pt that he has to stay at least a couple of hours due to medications that were administered then he will be discharged tonight. Pt was receptive and agreed to stay until tonight. Presently resting quietly in his room.

## 2022-09-23 NOTE — Progress Notes (Addendum)
Pt is admitted to [facility] due to substance abuse. Pt is alert and oriented X3. Pt is ambulatory and is oriented to staff/unit. Pt was cooperative the admission process and skin assessment. Pt denies pain and current SI/HI/AVH, plan or intent. 15 minutes safety checks initiated per order. Staff will monitor for pt's safety.

## 2022-09-23 NOTE — Discharge Instructions (Addendum)
Therapy Walk-in Hours  Monday and Wednesday: @ 7:20 for 8 AM until slots are full  Friday: @ 12 pm for 1 PM slot   For Monday to Wednesday, it is recommended that patients arrive between 7:20 AM and 7:45 AM because patients will be seen in the order of arrival.  For Friday, we ask that patients arrive between 12 PM to 12:30 PM.  Go to the second floor on arrival and check in.  **Availability is limited; therefore, patients may not be seen on the same day.**  Medication management walk-ins:  Monday through  Friday: 8 AM to 11 AM.  It is recommended that patients arrive by 7:30 AM to 7:45 AM because patients will be seen in the order of arrival.  Go to the second floor on arrival and check in.  **Availability is limited; therefore, patients may not be seen on the same day.**   SUBSTANCE USE TREATMENT for Medicaid and State Funded/IPRS  [facility] [address] [place], [place], [address] [phone] phone NOTE: [facility] is no longer offering IOP services.  Serves those who are low-income or have no insurance.  [facility] [address], [place], [place], [address] [phone] phone [phone] fax NOTE: Does have Substance Abuse-Intensive Outpatient Program [facility]) as well as transitional housing if eligible.  [facility] [address] [place], [place], [address] [phone] phone [phone] fax  [facility] [phone] [place], [place], [address] [phone] phone [phone] fax  HALFWAY HOUSES:  [facility] [phone]  [employer].oxfordvacancies.com  12 STEP PROGRAMS:  Alcoholics Anonymous of [employer]  Narcotics Anonymous of  [employer]  Al-Anon of [place], [place] www.[place]alanon.org/find-meetings.html  Nar-Anon https://nar-anon.org/find-a-meetin  List of Residential placements:   [facility] in [place]: [phone]  [facility]: [phone]  [place], [place] [phone]: Male and male facility; 30-day program: (uninsured and Medicaid such as Laurena Bering, [place], [place], partners)  [facility]: [phone]; men and women's facility; 28 days; Can have Medicaid tailored plan Tour manager or Partners)  [facility]: [phone] Karoline Caldwell or Larita Fife; 28 day program; must be fully detox; tailored Medicaid or no insurance  [address] in [place], [place]; [phone]; 28 day all males program; no insurance accepted  [facility] in [place]: Gabriel Rung [phone] (no insurance or Medicaid only); 90 days; outpatient services but provide housing in [place]  [facility] Admission: [phone]: Patient must complete phone screening for placement: [place], [place]; 6 month program; uninsured, Medicaid, and [employer].   [facility]: no insurance required; [phone]  [facility]: [phone]; Intake: Molly Maduro; Must fill out application online; Alecia Lemming Delay [phone] x [address] [facility] in [place], [place]: [phone]; Admissions Coordinators Mr. Maurine Minister or Barron Alvine; 90 day program.  [facility]: [place], [place] [phone]; Co-Ed 9 month to a year program; Online application; Men entry fee is $500 (6-58months);  [facility]: [address] [place], [place] [address]; no fee or insurance required; minimum of 2 years; Highly structured; work based; Intake Coordinator is Thayer Ohm [phone]  [facility] in [place], [place]: [phone]; Fax number is [phone]; website: www.[facility].org; Requires 3-6 page autobiography; 2 year program (18 months and then 23month transitional housing); Admission fee is $300; no insurance needed; work Automotive engineer in [place], [place]: [employer] Desk Staff: Danise Edge [phone]: They  have a Men's Regenerations Program 6-15months. Free program; There is an initial $300 fee however,  they are willing to work with patients regarding that. Application is online.  [facility]: Admissions [phone] Doran Heater ext [phone]; Any 7-90 day program is out of pocket; 12 month program is free of charge; there is a $275 entry fee; Patient is responsible for own transportation

## 2022-09-23 NOTE — ED Provider Notes (Signed)
Facility Based Crisis Admission H&P  Date: 09/23/22 Patient Name: Jay Moreno MRN: [id number] Chief Complaint: " Everything has hit the fan"  Diagnoses:  Final diagnoses:  None   Budd Palmer, 37 y.o., male patient seen face to face by this provider, consulted with Dr. Lucianne Muss; and chart reviewed on 09/23/22.   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 He last used around 8 AM this morning however reports that he has used for several hours throughout last evening.    He reports sleeping on average 3 hours within the last 24 hours.  He endorses increased alcohol consumption due to use of drugs.  He reports a few days ago he did trial fentanyl ingestion. Denies any history of recurrent IV drug use although endorses he had sampled Heroin intravenously sometime in the distant past.  Patient reports that he was  initially introduction to illicit substances at the age of 26.  He has used drugs intermittently with various degree of frequency throughout the years.  Prior to April 2024 he reports that he had used substances less frequently and 2 months ago he had a break-up with his significant other and this caused him to emotionally spiral out of control and he has been using methamphetamines daily drinking daily and subsequently stopped going to work.  He reports that he did not put in his FMLA paperwork and is unclear of the status of his employment at [employer].  He denies suicidal homicidal ideations.  He endorses at times he hears voices and endorses increased paranoia but feels this is likely related to his substance use.  He reports during his admission to  behavioral health inpatient back in 2018 that he was diagnosed with bipolar and discharged with medications however he never excepted diagnosis and did not take medications. He reports poor sleep quality due to binging on methamphetamines, but when he doesn't use meth daily he routinely is able to sleep without difficulty.  Denies any prior or recent attempts of suicide or self harm. Endorses only self harming behaviors is active substance use which he is aware is causing him problems.  During evaluation PAT Jay Moreno is sitting upright in no acute distress.  He  is alert, oriented x 4, calm, cooperative and attentive.  His mood is euthymic with congruent affect.  He has normal speech, and behavior.  Objectively there is no evidence of psychosis/mania or delusional thinking.  Patient is able to converse coherently, goal directed thoughts, no distractibility, or pre-occupation. He also denies suicidal/self-harm/homicidal ideation, psychosis, and paranoia.  Patient answered question appropriately.       PHQ 2-9:   Flowsheet Row ED from 09/23/2022 in [facility] Most recent reading at 09/23/2022  4:31 PM ED from 09/23/2022 in [facility] Most recent reading at 09/23/2022  1:54 PM ED from 08/27/2022 in [facility] Emergency Department at [facility] Most recent reading at 08/27/2022  8:14 PM  C-SSRS RISK CATEGORY No Risk No Risk No Risk       Total Time spent with patient: 45 minutes  Musculoskeletal  Strength & Muscle Tone: within normal limits Gait & Station: normal Patient leans: N/A  Psychiatric Specialty Exam  Presentation General Appearance: Appropriate for Environment  Eye Contact:Good  Speech:Clear and Coherent  Speech Volume:Normal  Handedness:Right   Mood and Affect  Mood:Euthymic  Affect:Appropriate   Thought Process  Thought Processes:Coherent  Descriptions of Associations:Intact  Orientation:Full  (Time, Place and Person)  Thought Content:Logical    Hallucinations:Hallucinations: None  Ideas of Reference:None  Suicidal Thoughts:Suicidal Thoughts: No  Homicidal Thoughts:Homicidal Thoughts: No   Sensorium  Memory:Immediate Good  Judgment:Good  Insight:Good   Executive Functions  Concentration:Good  Attention Span:Fair  Recall:Good  Fund of Knowledge:Good  Language:Good   Psychomotor Activity  Psychomotor Activity:No data recorded  Assets  Assets:Communication Skills; Physical Health; Housing; Social Support; Desire for Improvement   Sleep  Sleep:Sleep: Poor Number of Hours of Sleep: 3   Nutritional Assessment (For OBS and FBC admissions only) Has the patient had a weight loss or gain of 10 pounds or more in the last 3 months?: No Has the patient had a decrease in food intake/or appetite?: No Does the patient have dental problems?: No Does the patient have eating habits or behaviors that may be indicators of an eating disorder including binging or inducing vomiting?: No Has the patient recently lost weight without trying?: 0 Has the patient been eating poorly because of a decreased appetite?: 0 Malnutrition Screening Tool Score: 0    Physical Exam Vitals reviewed.  Constitutional:      Appearance: Normal appearance.  HENT:     Head: Normocephalic and atraumatic.  Eyes:     Extraocular Movements: Extraocular movements intact.     Conjunctiva/sclera: Conjunctivae normal.     Pupils: Pupils are equal, round, and reactive to light.  Cardiovascular:     Rate and Rhythm: Normal rate and regular rhythm.  Pulmonary:     Effort: Pulmonary effort is normal.     Breath sounds: Normal breath sounds.  Musculoskeletal:        General: Normal range of motion.     Cervical back: Normal range of motion and neck supple.  Skin:    Capillary Refill: Capillary refill takes less than 2 seconds.  Neurological:     General: No focal deficit present.      Mental Status: He is alert.     Review of Systems  Psychiatric/Behavioral:  Positive for substance abuse. Negative for hallucinations, memory loss and suicidal ideas. The patient has insomnia. The patient is not nervous/anxious.     Blood pressure (!) 143/92, pulse 95, temperature 98.5 F (36.9 C), temperature source Oral, resp. rate 19, SpO2 100%. There is no height or weight on file to calculate BMI.  Past Psychiatric History: See HPI   Is the patient at risk to self? No  Has the patient been a risk to self in the past 6 months? No .    Has the patient been a risk to self within the distant past? No   Is the patient a risk to others? No   Has the patient been a risk to others in the past 6 months? No   Has the patient been a risk to others within the distant past? No   Past Medical History: See HPI Family History: No pertinent family history reported  Social History: Currently lives alone. Employed by [employer] and Elsie [employer]. Recent break-up (X 2 month ago) with girlfriend who is the mother of his child, who is also a recovering addict.   Last Labs:  Admission on 09/23/2022, Discharged on 09/23/2022  Component Date Value Ref Range Status   WBC 09/23/2022  8.5  4.0 - 10.5 K/uL Final   RBC 09/23/2022 4.97  4.22 - 5.81 MIL/uL Final   Hemoglobin 09/23/2022 14.4  13.0 - 17.0 g/dL Final   HCT 30/86/5784 42.9  39.0 - 52.0 % Final   MCV 09/23/2022 86.3  80.0 - 100.0 fL Final   MCH 09/23/2022 29.0  26.0 - 34.0 pg Final   MCHC 09/23/2022 33.6  30.0 - 36.0 g/dL Final   RDW 69/62/9528 12.1  11.5 - 15.5 % Final   Platelets 09/23/2022 376  150 - 400 K/uL Final   nRBC 09/23/2022 0.0  0.0 - 0.2 % Final   Neutrophils Relative % 09/23/2022 82  % Final   Neutro Abs 09/23/2022 7.0  1.7 - 7.7 K/uL Final   Lymphocytes Relative 09/23/2022 10  % Final   Lymphs Abs 09/23/2022 0.8  0.7 - 4.0 K/uL Final   Monocytes Relative 09/23/2022 7  % Final   Monocytes Absolute 09/23/2022 0.6  0.1 - 1.0 K/uL Final    Eosinophils Relative 09/23/2022 1  % Final   Eosinophils Absolute 09/23/2022 0.1  0.0 - 0.5 K/uL Final   Basophils Relative 09/23/2022 0  % Final   Basophils Absolute 09/23/2022 0.0  0.0 - 0.1 K/uL Final   Immature Granulocytes 09/23/2022 0  % Final   Abs Immature Granulocytes 09/23/2022 0.03  0.00 - 0.07 K/uL Final   Performed at [facility], [address] [place], [place] [address]   Hgb A1c MFr Bld 09/23/2022 5.9 (H)  4.8 - 5.6 % Final   Comment: (NOTE) Pre diabetes:          5.7%-6.4%  Diabetes:              >6.4%  Glycemic control for   <7.0% adults with diabetes    Mean Plasma Glucose 09/23/2022 122.63  mg/dL Final   Performed at [facility], [address] [place], [place] [address]   POC Amphetamine UR 09/23/2022 Positive (A)  NONE DETECTED (Cut Off Level 1000 ng/mL) Final   POC Secobarbital (BAR) 09/23/2022 None Detected  NONE DETECTED (Cut Off Level 300 ng/mL) Final   POC Buprenorphine (BUP) 09/23/2022 None Detected  NONE DETECTED (Cut Off Level 10 ng/mL) Final   POC Oxazepam (BZO) 09/23/2022 None Detected  NONE DETECTED (Cut Off Level 300 ng/mL) Final   POC Cocaine UR 09/23/2022 None Detected  NONE DETECTED (Cut Off Level 300 ng/mL) Final   POC Methamphetamine UR 09/23/2022 Positive (A)  NONE DETECTED (Cut Off Level 1000 ng/mL) Final   POC Morphine 09/23/2022 None Detected  NONE DETECTED (Cut Off Level 300 ng/mL) Final   POC Methadone UR 09/23/2022 None Detected  NONE DETECTED (Cut Off Level 300 ng/mL) Final   POC Oxycodone UR 09/23/2022 None Detected  NONE DETECTED (Cut Off Level 100 ng/mL) Final   POC Marijuana UR 09/23/2022 None Detected (A)  NONE DETECTED (Cut Off Level 50 ng/mL) Final  Labs Per [facility] CPE 04/09/22 WBC 7.1 4.0-11.0 K/ul    RBC 4.81 4.20-5.80 M/uL    HGB 13.9 13.0-17.0 g/dL    HCT 72.5 36.6-44.0 %    MCV 86.3 80.0-94.0 fL    MCH 29.0 27.0-33.0 pg    MCHC 33.6 32.0-36.0 g/dL    RDW 34.7 42.5-95.6  %    PLT 330 150-400 K/uL    MPV 8.4 7.5-10.7 fL    NE% 58.4 43.3-71.9 %    LY% 26.3 16.8-43.5 %    MO%  10.7 4.6-12.4 %    EO% 4.1 0.0-7.8 %    BA% 0.5 0.0-1.0 %    NE# 4.2 1.9-7.2 K/uL    LY# 1.90 1.10-2.70 K/uL    MO# 0.8 0.3-0.8 K/uL    EO# 0.3 0.0-0.6 K/uL    BA# 0.0 0.0-0.1 K/uL    NRBC% 0.10      NRBC# 0.00      Comp Metabolic Panel Reviewed date:04/09/2022 03:27:29 PM Interpretation:normal Performing Lab: Notes/Report: Testing Performed at: [facility], [address], [address], [place], [place] [address]  Glucose 91 70-99 mg/dL    BUN 14 2-13 mg/dL    Creatinine 0.86 5.78-4.69 mg/dl    [id number] 97 >32 calc In accordance with recommendations from NKF-ASN Task Force, [facility] has updated its eGFR calc to the 2021 CKD-EDI equation that estimates kidney function without a race variable;Stage 1 > 90 ML/Min plus Albuminuria;Stage 2 60-89 ML/MIN;Stage 3 30-59 ML/MIN;Stage 4 15-29 ML/MIN;Stage 5 <15 ML/MIN  Sodium 139 136-145 mmol/L    Potassium 4.6 3.5-5.5 mmol/L    Chloride 105 98-107 mmol/L    CO2 29 22-32 mmol/L    Anion Gap 9.3 6.0-20.0 mmol/L    Calcium 9.4 8.6-10.3 mg/dL    CA-corrected 4.40 1.02-72.53 mg/dL    Protein, Total 6.9 6.6-4.4 g/dL    Albumin 4.4 0.3-4.7 g/dL    TBIL 0.4 4.2-5.9 mg/dL    ALP 71 56-387 U/L    AST 26 0-39 U/L    ALT 43 0-52 U/L    Lipid Panel w/reflex Reviewed date:04/09/2022 03:27:20 PM Interpretation:LDL 96 Performing Lab: Notes/Report: Testing Performed at: [facility], [address], [address], [place], [place] [address]  Cholesterol 167 <200 mg/dL    CHOL/HDL 3.7 2.9-5.1 Ratio    HDLD 45 30-70 mg/dL Values below 40 mg/dL indicate increased risk factor  Triglyceride 145 0-199 mg/dL    NHDL 884 1-660 mg/dL Range dependent upon risk factors.  LDL Chol Calc (NIH) 96 0-99 mg/dL     Allergies: Patient has no known allergies.  Medications:  Facility Ordered Medications  Medication   methocarbamol (ROBAXIN) tablet 500 mg    [COMPLETED] cloNIDine (CATAPRES) tablet 0.1 mg   [COMPLETED] OLANZapine zydis (ZYPREXA) disintegrating tablet 5 mg   dicyclomine (BENTYL) tablet 20 mg   hydrOXYzine (ATARAX) tablet 25 mg   loperamide (IMODIUM) capsule 2-4 mg   naproxen (NAPROSYN) tablet 500 mg   ondansetron (ZOFRAN-ODT) disintegrating tablet 4 mg    Long Term Goals: Improvement in symptoms so as ready for discharge and plan is to transfer to [facility]   Short Term Goals: Patient will verbalize feelings in meetings with treatment team members., Patient will attend at least of 50% of the groups daily., Pt will complete the PHQ9 on admission, day 3 and discharge., Patient will participate in completing the [place] Suicide Severity Rating Scale, Patient will score a low risk of violence for 24 hours prior to discharge, and Patient will take medications as prescribed daily.  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 Per [facility], patient has had a recent physical and routine labs all of which are unremarkable and he currently has no chronic medical conditions.   -Clonidine protocol initiated  Recommendations  Based on my evaluation the patient does not appear to have an emergency medical condition.  Joaquin Courts, NP-C 09/23/22  4:58 PM

## 2022-09-24 ENCOUNTER — Encounter (HOSPITAL_COMMUNITY): Payer: Self-pay

## 2022-09-24 DIAGNOSIS — F15259 Other stimulant dependence with stimulant-induced psychotic disorder, unspecified: Secondary | ICD-10-CM | POA: Diagnosis not present

## 2022-09-24 DIAGNOSIS — F331 Major depressive disorder, recurrent, moderate: Secondary | ICD-10-CM | POA: Diagnosis not present

## 2022-09-24 DIAGNOSIS — M549 Dorsalgia, unspecified: Secondary | ICD-10-CM | POA: Diagnosis not present

## 2022-09-24 DIAGNOSIS — R45851 Suicidal ideations: Secondary | ICD-10-CM | POA: Diagnosis not present
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
## 2022-09-24 NOTE — Progress Notes (Signed)
Pt is awake, alert and oriented X3. Pt complained of generalized pain. No signs of acute distress noted. PRN Robaxin, Naprosyn and scheduled meds administered per order. Pt denies current SI/HI/AVH, plan or intent. Staff will monitor for pt's safety.

## 2022-09-24 NOTE — ED Provider Notes (Signed)
FBC/OBS ASAP Discharge Summary  Date and Time: 09/24/2022 12:43 PM  Name: Jay Moreno  MRN:  [id number]   Discharge Diagnoses:  Final diagnoses:  Methamphetamine-induced mood disorder (HCC)  Moderate episode of recurrent major depressive disorder (HCC)    Subjective: Jay Moreno is a 37 year old male, with history of MDD, Methamphetamine use current daily, polysubstance use (intermittently over time Fentanyl, Heroin), ETOH use, who presents to [facility] voluntarily requesting detox with a plan to be transferred to [facility] for long-term treatment of recurrent methamphetamine use disorder.   Patient met today with the treatment team discuss plan of care. Patient states he was "mostly clean" for 3 years, working at [employer] and only substance used was alcohol occasionally at social gatherings. Reports that people at work mentioned they thought he might have ADHD. Patient saw [facility] who referred patient to attention specialist. Around deceased father's birthday in 19-May-2022, patient started methamphetamine again to "self-medicate". 2 months ago, patient broke off relationship with partner Shanda Bumps, at which point he increased his methamphetamine use to 0.5-1mg  daily. Patient reports he felt psychotic and paranoid, stopped going to work. Reports he came in now because his ex-partner went through [facility] and has been encouraging patient to also seek help, because he wants to be a good father for his 62-year-old son, and because he is sad and ashamed of his substance use.   Patient reports extensive history of substance use. Patient started using methamphetamine as a teen and has used on and off since then. Patient reports using oral fentanyl for the first time starting about 2 weeks ago until last week. Patient has distant history of heroin as a teen but no IV drug use since. Patient reports he has never heavily used alcohol, currently drinking 2-4 units per week. Patient is a current tobacco user and requests  nicotine patch and gum. Patient reports only current physical symptoms are back pain, "skin crawling".   Patient's goals include detox followed by residential or outpatient rehab, outpatient therapy, and becoming employed again. Encouraged patient to go to a 21 or 28 day residential rehab program because of long addiction history and high risk for relapse.  Later in the day, patient informed provider that he wishes to discharge back home. I encouraged patient to stay for detox and later be connected with more intensive treatment but he denied wanting this at this time. Patient is discharged back home with OP services.  Stay Summary: Patient was admitted yesterday and was very apprehensive with staying in the [facility]. Today in the afternoon, he requests discharge to return to his home. He wishes for outpatient resources for medication management and therapy. He does not want to go to residential nor CDIOP.  Total Time spent with patient: 45 minutes  Past Psychiatric History: diagnosed MDD, bipolar. Reports took a medication for bipolar for awhile, "I don't think it worked". Reports symptoms of depression, anxiety, AVH, and paranoid thoughts only while using substances. Past Medical History: None. Current PCP: [facility] Family History: Father had HTN Family Psychiatric  History: Mother anxiety, AUD. Sister anxiety. Father binge alcohol use Social History: Lives alone in apartment in [place]. Worked at [employer] for 3 years, let go sometime over past 2 months and currently uninsured. Unmarried, 1 child (son). Support system includes mother who lives nearby. Legal issues: possible speeding ticket, unsure of court date. Firearms: owns and has access to multiple guns locked away at home.  Current Medications:  Current Facility-Administered  Medications  Medication Dose Route Frequency Provider Last Rate Last Admin   dicyclomine (BENTYL) tablet 20 mg  20 mg Oral Q6H PRN Bing Neighbors, NP       hydrOXYzine (ATARAX) tablet 25 mg  25 mg Oral Q6H PRN Bing Neighbors, NP   25 mg at 09/24/22 1610   loperamide (IMODIUM) capsule 2-4 mg  2-4 mg Oral PRN Bing Neighbors, NP       methocarbamol (ROBAXIN) tablet 500 mg  500 mg Oral Q8H PRN Bing Neighbors, NP   500 mg at 09/24/22 0817   naproxen (NAPROSYN) tablet 500 mg  500 mg Oral BID PRN Bing Neighbors, NP   500 mg at 09/24/22 0817   nicotine (NICODERM CQ - dosed in mg/24 hours) patch 14 mg  14 mg Transdermal Daily Onuoha, Chinwendu V, NP   14 mg at 09/24/22 0816   nicotine polacrilex (NICORETTE) gum 2 mg  2 mg Oral QID PRN Onuoha, Chinwendu V, NP   2 mg at 09/24/22 1217   ondansetron (ZOFRAN-ODT) disintegrating tablet 4 mg  4 mg Oral Q6H PRN Bing Neighbors, NP   4 mg at 09/24/22 0817   traZODone (DESYREL) tablet 50 mg  50 mg Oral QHS PRN Onuoha, Chinwendu V, NP   50 mg at 09/23/22 2155   Current Outpatient Medications  Medication Sig Dispense Refill   Multiple Vitamins-Minerals (ADULT GUMMY PO) Take 2 tablets by mouth daily.     naproxen sodium (ALEVE) 220 MG tablet Take 220 mg by mouth 2 (two) times daily as needed (For body aches and fever).     Zinc Citrate (ZINC GUMMY PO) Take 2 tablets by mouth daily.      PTA Medications:  Facility Ordered Medications  Medication   methocarbamol (ROBAXIN) tablet 500 mg   [COMPLETED] cloNIDine (CATAPRES) tablet 0.1 mg   [COMPLETED] OLANZapine zydis (ZYPREXA) disintegrating tablet 5 mg   dicyclomine (BENTYL) tablet 20 mg   hydrOXYzine (ATARAX) tablet 25 mg   loperamide (IMODIUM) capsule 2-4 mg   naproxen (NAPROSYN) tablet 500 mg   ondansetron (ZOFRAN-ODT) disintegrating tablet 4 mg   traZODone (DESYREL) tablet 50 mg   nicotine polacrilex (NICORETTE) gum 2 mg   nicotine (NICODERM CQ - dosed in mg/24 hours) patch 14 mg       09/24/2022   12:42 PM 09/23/2022    5:33 PM  Depression screen PHQ 2/9  Decreased Interest 1 1  Down, Depressed, Hopeless 2 2  PHQ  - 2 Score 3 3  Altered sleeping 3 3  Tired, decreased energy 2 2  Change in appetite 2 2  Feeling bad or failure about yourself  3 3  Trouble concentrating 3 3  Moving slowly or fidgety/restless 2 2  Suicidal thoughts 0 0  PHQ-9 Score 18 18  Difficult doing work/chores  Very difficult    Flowsheet Row ED from 09/23/2022 in [facility] Most recent reading at 09/23/2022  4:31 PM ED from 09/23/2022 in [facility] Most recent reading at 09/23/2022  1:54 PM ED from 08/27/2022 in [facility] Emergency Department at [facility] Most recent reading at 08/27/2022  8:14 PM  C-SSRS RISK CATEGORY No Risk No Risk No Risk       Musculoskeletal  Strength & Muscle Tone: within normal limits Gait & Station: normal Patient leans: N/A  Psychiatric Specialty Exam  Presentation  General Appearance:  Appropriate for Environment; Fairly Groomed  Eye Contact:  Good  Speech: Clear and Coherent  Speech Volume: Normal  Handedness: Right   Mood and Affect  Mood: Anxious; Depressed  Affect: Depressed; Tearful   Thought Process  Thought Processes: Coherent; Goal Directed; Linear  Descriptions of Associations:Intact  Orientation:Full (Time, Place and Person)  Thought Content:Logical  Diagnosis of Schizophrenia or Schizoaffective disorder in past: No    Hallucinations:Hallucinations: Tactile  Ideas of Reference:None  Suicidal Thoughts:Suicidal Thoughts: No  Homicidal Thoughts:Homicidal Thoughts: No   Sensorium  Memory: Immediate Good; Recent Good; Remote Good  Judgment: Fair  Insight: Fair   Art therapist  Concentration: Good  Attention Span: Good  Recall: Good  Fund of Knowledge: Good  Language: Good   Psychomotor Activity  Psychomotor Activity: Psychomotor Activity: Normal   Assets  Assets: Communication Skills; Desire for Improvement; Housing; Resilience; Physical  Health   Sleep  Sleep: Sleep: Fair Number of Hours of Sleep: 3   Nutritional Assessment (For OBS and FBC admissions only) Has the patient had a weight loss or gain of 10 pounds or more in the last 3 months?: No Has the patient had a decrease in food intake/or appetite?: No Does the patient have dental problems?: No Does the patient have eating habits or behaviors that may be indicators of an eating disorder including binging or inducing vomiting?: No Has the patient recently lost weight without trying?: 0 Has the patient been eating poorly because of a decreased appetite?: 0 Malnutrition Screening Tool Score: 0    Physical Exam  Physical Exam Constitutional:      Appearance: Normal appearance.  HENT:     Head: Normocephalic and atraumatic.     Nose: Nose normal.     Mouth/Throat:     Mouth: Mucous membranes are moist.     Pharynx: Oropharynx is clear.  Eyes:     Extraocular Movements: Extraocular movements intact.     Conjunctiva/sclera: Conjunctivae normal.     Pupils: Pupils are equal, round, and reactive to light.  Cardiovascular:     Rate and Rhythm: Normal rate.  Pulmonary:     Effort: Pulmonary effort is normal.  Abdominal:     General: Abdomen is flat.     Palpations: Abdomen is soft.  Musculoskeletal:        General: Normal range of motion.     Cervical back: Normal range of motion and neck supple.  Skin:    General: Skin is warm and dry.  Neurological:     General: No focal deficit present.     Mental Status: He is alert and oriented to person, place, and time.      Review of Systems  Constitutional: Negative.   HENT: Negative.    Eyes: Negative.   Respiratory: Negative.    Cardiovascular: Negative.   Gastrointestinal: Negative.   Genitourinary: Negative.   Musculoskeletal:  Positive for back pain.  Skin: Negative.   Neurological: Negative.   Psychiatric/Behavioral:  Positive for depression, hallucinations and substance abuse. Negative for  suicidal ideas. The patient is nervous/anxious.        Feels skin crawling (tactile hallucination)    Blood pressure 128/69, pulse 67, temperature 98.4 F (36.9 C), temperature source Oral, resp. rate 18, SpO2 100%. There is no height or weight on file to calculate BMI. Blood pressure 128/69, pulse 67, temperature 98.4 F (36.9 C), temperature source Oral, resp. rate 18, SpO2 100%. There is no height or weight on file to calculate BMI.  Demographic Factors:  Male, Caucasian, Living alone, and Access to  firearms  Loss Factors: Loss of significant relationship  Historical Factors: Impulsivity  Risk Reduction Factors:   Positive coping skills or problem solving skills  Continued Clinical Symptoms:  Alcohol/Substance Abuse/Dependencies  Cognitive Features That Contribute To Risk:  Closed-mindedness    Suicide Risk:  Mild:  Suicidal ideation of limited frequency, intensity, duration, and specificity.  There are no identifiable plans, no associated intent, mild dysphoria and related symptoms, good self-control (both objective and subjective assessment), few other risk factors, and identifiable protective factors, including available and accessible social support.  Plan Of Care/Follow-up recommendations:  Activity as tolerated Regular diet Continue prescription medications See PCP for medical conditions   Disposition: Home with OP resources  Lance Muss, MD 09/24/2022, 12:43 PM

## 2022-09-24 NOTE — Tx Team (Signed)
LCSW, MD, and Resident met with patient to assess current mood, affect, physical state, and inquire about needs/goals while here in [facility] and after discharge. Patient reports he presented due to needing to seek help for his meth use. Patient reports he has been using meth off and on since the age of 50. Patient reports he uses on average of 1/2 gram to a gram per day. Patient reports he tried fentanyl via xanax for the first time about a week ago, and knows that things have just spiraled downhill since "situation with partner". Patient reports he ended up opening his partner's computer and saw a lot of stuff that has broken his trust. Patient reports he has been feeling low, lethargic, and ashamed about his decisions. Patient denies SI/HI/AVH. Patient reports he has a 65 and a half year old son that is currently with his partner. Patient reports stressors regarding his current relationship, however reports being a hopeless romantic believing that things will change. Patient reports his partner is also getting help for substance use and reports she completed the program at [facility] and is now in IOP services. Patient reports he is hoping to make a change for himself, however he is unsure if he will be able to go to a facility for 28-30 days and then still afford his rent. Brief supportive counseling was provided to the patient and he was also provided other alternative options for treatment. Patient reports he would like to do CDIOP for now as he believes that would be most appropriate with him being able to still work as needed. Patient reports occasional alcohol use stating he drinking maybe 2-4 beers a week, however does not view this as a struggle for him. Patient reports he has experienced psychosis while under the influence, however never while sober. Patient reports he has found himself self-medicating with meth and reports this was around his father's "heavenly birthday" on April 20th. Patient reports he came in  because he notices that his substance use is affecting other areas of his life including his employment. Patient reports he used to work for [employer] for about 3 years, however no longer has a job anymore. Patient reports he does not have any insurance so he is not sure what would be covered regarding services. Patient reports he lives at home alone in an apartment here in [place].  Patient provided LCSW with permission to speak with his mother as needed. Belenda Cruise [phone]. Patient reports he has tried treatment in the past and has been successful, however has the barrier of rent over his head at this moment. Patient denies any legal charges other than a speeding ticket. Patient reports some withdrawals of skin crawling, being tired, and back pain. Medications were addressed with MD.   Team to provide patient time to think about his options for treatment. Team will follow up at a later time.     Fernande Boyden, LCSW Clinical Social Worker [facility] Ph: [phone]

## 2022-09-24 NOTE — Progress Notes (Signed)
Pt reported generalized pain, PRN medications given along with scheduled meds. No further complaints at this time.

## 2022-09-24 NOTE — ED Notes (Signed)
Patient is sleeping. Respirations equal and unlabored, skin warm and dry. No change in assessment or acuity. Routine safety checks conducted according to facility protocol. Will continue to monitor for safety.   

## 2022-09-24 NOTE — ED Notes (Signed)
Patient is sleeping. Respirations equal and unlabored, skin warm and dry. No change in assessment or acuity. Routine safety checks conducted according to facility protocol. Will continue to monitor for safety.

## 2022-09-24 NOTE — Discharge Summary (Signed)
Jay Moreno to be D/C'd Home per MD order. Discussed with the patient and all questions fully answered. An After Visit Summary was printed and given to the patient. All belongings returned. Patient escorted out and D/C home via private auto.  [name]  09/24/2022 1:02 PM

## 2022-09-24 NOTE — BH IP Treatment Plan (Signed)
Interdisciplinary Treatment and Diagnostic Plan Update  09/24/2022 Time of Session: 10:40AM Jay Moreno MRN: [id number]  Diagnosis:  Final diagnoses:  Methamphetamine-induced mood disorder (HCC)  Moderate episode of recurrent major depressive disorder (HCC)     Current Medications:  Current Facility-Administered Medications  Medication Dose Route Frequency Provider Last Rate Last Admin   dicyclomine (BENTYL) tablet 20 mg  20 mg Oral Q6H PRN Jay Neighbors, NP       hydrOXYzine (ATARAX) tablet 25 mg  25 mg Oral Q6H PRN Jay Neighbors, NP   25 mg at 09/24/22 0817   loperamide (IMODIUM) capsule 2-4 mg  2-4 mg Oral PRN Jay Neighbors, NP       methocarbamol (ROBAXIN) tablet 500 mg  500 mg Oral Q8H PRN Jay Neighbors, NP   500 mg at 09/24/22 0817   naproxen (NAPROSYN) tablet 500 mg  500 mg Oral BID PRN Jay Neighbors, NP   500 mg at 09/24/22 0817   nicotine (NICODERM CQ - dosed in mg/24 hours) patch 14 mg  14 mg Transdermal Daily Moreno, Jay V, NP   14 mg at 09/24/22 0816   nicotine polacrilex (NICORETTE) gum 2 mg  2 mg Oral QID PRN Moreno, Jay V, NP   2 mg at 09/23/22 2156   ondansetron (ZOFRAN-ODT) disintegrating tablet 4 mg  4 mg Oral Q6H PRN Jay Neighbors, NP   4 mg at 09/24/22 0817   traZODone (DESYREL) tablet 50 mg  50 mg Oral QHS PRN Moreno, Jay V, NP   50 mg at 09/23/22 2155   Current Outpatient Medications  Medication Sig Dispense Refill   Multiple Vitamins-Minerals (ADULT GUMMY PO) Take 2 tablets by mouth daily.     naproxen sodium (ALEVE) 220 MG tablet Take 220 mg by mouth 2 (two) times daily as needed (For body aches and fever).     Zinc Citrate (ZINC GUMMY PO) Take 2 tablets by mouth daily.     PTA Medications: Prior to Admission medications   Medication Sig Start Date End Date Taking? Authorizing Provider  Multiple Vitamins-Minerals (ADULT GUMMY PO) Take 2 tablets by mouth daily.    [provider]  naproxen sodium  (ALEVE) 220 MG tablet Take 220 mg by mouth 2 (two) times daily as needed (For body aches and fever).    [provider]  Zinc Citrate (ZINC GUMMY PO) Take 2 tablets by mouth daily.    [provider]    Patient Stressors: Financial difficulties   Substance abuse   Other: relational barriers with partner    Patient Strengths: Ability for insight  Average or above average intelligence  Capable of independent living  Communication skills  General fund of knowledge  Motivation for treatment/growth  Supportive family/friends  Work skills   Treatment Modalities: Medication Management, Group therapy, Case management,  1 to 1 session with clinician, Psychoeducation, Recreational therapy.   Physician Treatment Plan for Primary and Secondary Diagnosis:  Final diagnoses:  Methamphetamine-induced mood disorder (HCC)  Moderate episode of recurrent major depressive disorder (HCC)   Long Term Goal(s): Improvement in symptoms so as ready for discharge plan is to transfer to [facility]   Short Term Goals: Patient will verbalize feelings in meetings with treatment team members. Patient will attend at least of 50% of the groups daily. Pt will complete the PHQ9 on admission, day 3 and discharge. Patient will participate in completing the [place] Suicide Severity Rating Scale Patient will score a low  risk of violence for 24 hours prior to discharge Patient will take medications as prescribed daily.  Medication Management: Evaluate patient's response, side effects, and tolerance of medication regimen.  Therapeutic Interventions: 1 to 1 sessions, Unit Group sessions and Medication administration.  Evaluation of Outcomes: Progressing  LCSW Treatment Plan for Primary Diagnosis:  Final diagnoses:  Methamphetamine-induced mood disorder (HCC)  Moderate episode of recurrent major depressive disorder (HCC)    Long Term Goal(s): Safe transition to appropriate next  level of care at discharge.  Short Term Goals: Facilitate acceptance of mental health diagnosis and concerns through verbal commitment to aftercare plan and appointments at discharge., Patient will identify one social support prior to discharge to aid in patient's recovery., Patient will attend AA/NA groups as scheduled., Identify minimum of 2 triggers associated with mental health/substance abuse issues with treatment team members., and Increase skills for wellness and recovery by attending 50% of scheduled groups.  Therapeutic Interventions: Assess for all discharge needs, 1 to 1 time with Child psychotherapist, Explore available resources and support systems, Assess for adequacy in community support network, Educate family and significant other(s) on suicide prevention, Complete Psychosocial Assessment, Interpersonal group therapy.  Evaluation of Outcomes: Progressing   Progress in Treatment: Attending groups: Yes. Participating in groups: Yes. Taking medication as prescribed: Yes. Toleration medication: Yes. Family/Significant other contact made: No, will contact:  Patient's Mother Jay Moreno [phone] Patient understands diagnosis: Yes. Discussing patient identified problems/goals with staff: Yes. Medical problems stabilized or resolved: Yes. Denies suicidal/homicidal ideation: Yes. Issues/concerns per patient self-inventory: Yes. Other: substance use and need for further treatment. However, has concerns about being able to afford his rent for November if he is gone for 28-30 days.   New problem(s) identified: No, Describe:  other than reported on admission.   New Short Term/Long Term Goal(s): Safe transition to appropriate next level of care at discharge, Engage patient in therapeutic group addressing interpersonal concerns. Engage patient in aftercare planning with referrals and resources, Increase ability to appropriately verbalize feelings, Facilitate acceptance of mental health  diagnosis and concerns and Identify triggers associated with mental health/substance abuse issues.   Patient Goals: Patient made aware of current recommendation for residential placement. Patient reports he knows he needs to go, however he is unsure if he would be able to afford his rent for the following month if he is gone for 28-30 days with no pay. Brief supportive counseling was provided to the patient. Patient was also informed of option for CDIOP.   Discharge Plan or Barriers: Patient expresses an interest in CDIOP services rather than residential placement at this time. Patient reports he may consider residential, however may need some time to think about it as he is fighting the urge of not leaving AMA.   Reason for Continuation of Hospitalization: Medication stabilization Withdrawal symptoms  Estimated Length of Stay: 3-5 days  Last 3 [place] Suicide Severity Risk Score: Flowsheet Row ED from 09/23/2022 in [facility] Most recent reading at 09/23/2022  4:31 PM ED from 09/23/2022 in [facility] Most recent reading at 09/23/2022  1:54 PM ED from 08/27/2022 in [facility] Emergency Department at [facility] Most recent reading at 08/27/2022  8:14 PM  C-SSRS RISK CATEGORY No Risk No Risk No Risk       Last PHQ 2/9 Scores:    09/23/2022    5:33 PM  Depression screen PHQ 2/9  Decreased Interest 1  Down, Depressed, Hopeless 2  PHQ -  2 Score 3  Altered sleeping 3  Tired, decreased energy 2  Change in appetite 2  Feeling bad or failure about yourself  3  Trouble concentrating 3  Moving slowly or fidgety/restless 2  Suicidal thoughts 0  PHQ-9 Score 18  Difficult doing work/chores Very difficult    Scribe for Treatment Team: Lenny Pastel 09/24/2022 11:46 AM

## 2022-09-24 NOTE — ED Provider Notes (Addendum)
Behavioral Health Progress Note  Date and Time: 09/24/2022 11:35 AM Name: Jay Moreno MRN:  [id number]  Subjective:  Jay Moreno is a 37 year old male, with history of MDD, Methamphetamine use current daily, polysubstance use (intermittently over time Fentanyl, Heroin), ETOH use, who presents to [facility] voluntarily requesting detox with a plan to be transferred to [facility] for long-term treatment of recurrent methamphetamine use disorder.  Patient met today with the treatment team discuss plan of care. Patient states he was "mostly clean" for 3 years, working at [employer] and only substance used was alcohol occasionally at social gatherings. Reports that people at work mentioned they thought he might have ADHD. Patient saw [facility] who referred patient to attention specialist. Around deceased father's birthday in 05/06/22, patient started methamphetamine again to "self-medicate". 2 months ago, patient broke off relationship with partner Shanda Bumps, at which point he increased his methamphetamine use to 0.5-1mg  daily. Patient reports he felt psychotic and paranoid, stopped going to work. Reports he came in now because his ex-partner went through [facility] and has been encouraging patient to also seek help, because he wants to be a good father for his 43-year-old son, and because he is sad and ashamed of his substance use.  Patient reports extensive history of substance use. Patient started using methamphetamine as a teen and has used on and off since then. Patient reports using oral fentanyl for the first time starting about 2 weeks ago until last week. Patient has distant history of heroin as a teen but no IV drug use since. Patient reports he has never heavily used alcohol, currently drinking 2-4 units per week. Patient is a current tobacco user and requests nicotine patch and gum. Patient reports only current physical symptoms are back pain, "skin crawling".  Patient's goals include detox followed by residential or  outpatient rehab, outpatient therapy, and becoming employed again. Encouraged patient to go to a 21 or 28 day residential rehab program because of long addiction history and high risk for relapse.  Diagnosis:  Final diagnoses:  Methamphetamine-induced mood disorder (HCC)  Moderate episode of recurrent major depressive disorder (HCC)    Total Time spent with patient: 30 minutes  Past Psychiatric History: diagnosed MDD, bipolar. Reports took a medication for bipolar for awhile, "I don't think it worked". Reports symptoms of depression, anxiety, AVH, and paranoid thoughts only while using substances. Past Medical History: None. Current PCP: [facility] Family History: Father had HTN Family Psychiatric  History: Mother anxiety, AUD. Sister anxiety. Father binge alcohol use Social History: Lives alone in apartment in [place]. Worked at [employer] for 3 years, let go sometime over past 2 months and currently uninsured. Unmarried, 1 child (son). Support system includes mother who lives nearby. Legal issues: possible speeding ticket, unsure of court date. Firearms: owns and has access to multiple guns locked away at home.  Additional Social History:                         Sleep: Fair  Appetite:  Fair  Current Medications:  Current Facility-Administered Medications  Medication Dose Route Frequency Provider Last Rate Last Admin   dicyclomine (BENTYL) tablet 20 mg  20 mg Oral Q6H PRN Bing Neighbors, NP       hydrOXYzine (ATARAX) tablet 25 mg  25 mg Oral Q6H PRN Bing Neighbors, NP   25 mg at 09/24/22 0817   loperamide (IMODIUM) capsule 2-4 mg  2-4 mg  Oral PRN Bing Neighbors, NP       methocarbamol (ROBAXIN) tablet 500 mg  500 mg Oral Q8H PRN Bing Neighbors, NP   500 mg at 09/24/22 0817   naproxen (NAPROSYN) tablet 500 mg  500 mg Oral BID PRN Bing Neighbors, NP   500 mg at 09/24/22 0817   nicotine (NICODERM CQ - dosed in mg/24 hours) patch  14 mg  14 mg Transdermal Daily Onuoha, Chinwendu V, NP   14 mg at 09/24/22 0816   nicotine polacrilex (NICORETTE) gum 2 mg  2 mg Oral QID PRN Onuoha, Chinwendu V, NP   2 mg at 09/23/22 2156   ondansetron (ZOFRAN-ODT) disintegrating tablet 4 mg  4 mg Oral Q6H PRN Bing Neighbors, NP   4 mg at 09/24/22 0817   traZODone (DESYREL) tablet 50 mg  50 mg Oral QHS PRN Onuoha, Chinwendu V, NP   50 mg at 09/23/22 2155   Current Outpatient Medications  Medication Sig Dispense Refill   Multiple Vitamins-Minerals (ADULT GUMMY PO) Take 2 tablets by mouth daily.     naproxen sodium (ALEVE) 220 MG tablet Take 220 mg by mouth 2 (two) times daily as needed (For body aches and fever).     Zinc Citrate (ZINC GUMMY PO) Take 2 tablets by mouth daily.      Labs  Lab Results:  Admission on 09/23/2022, Discharged on 09/23/2022  Component Date Value Ref Range Status   WBC 09/23/2022 8.5  4.0 - 10.5 K/uL Final   RBC 09/23/2022 4.97  4.22 - 5.81 MIL/uL Final   Hemoglobin 09/23/2022 14.4  13.0 - 17.0 g/dL Final   HCT 54/62/7035 42.9  39.0 - 52.0 % Final   MCV 09/23/2022 86.3  80.0 - 100.0 fL Final   MCH 09/23/2022 29.0  26.0 - 34.0 pg Final   MCHC 09/23/2022 33.6  30.0 - 36.0 g/dL Final   RDW 00/93/8182 12.1  11.5 - 15.5 % Final   Platelets 09/23/2022 376  150 - 400 K/uL Final   nRBC 09/23/2022 0.0  0.0 - 0.2 % Final   Neutrophils Relative % 09/23/2022 82  % Final   Neutro Abs 09/23/2022 7.0  1.7 - 7.7 K/uL Final   Lymphocytes Relative 09/23/2022 10  % Final   Lymphs Abs 09/23/2022 0.8  0.7 - 4.0 K/uL Final   Monocytes Relative 09/23/2022 7  % Final   Monocytes Absolute 09/23/2022 0.6  0.1 - 1.0 K/uL Final   Eosinophils Relative 09/23/2022 1  % Final   Eosinophils Absolute 09/23/2022 0.1  0.0 - 0.5 K/uL Final   Basophils Relative 09/23/2022 0  % Final   Basophils Absolute 09/23/2022 0.0  0.0 - 0.1 K/uL Final   Immature Granulocytes 09/23/2022 0  % Final   Abs Immature Granulocytes 09/23/2022 0.03  0.00 -  0.07 K/uL Final   Performed at [facility], [address] [place], [place] [address]   Sodium 09/23/2022 135  135 - 145 mmol/L Final   Potassium 09/23/2022 3.9  3.5 - 5.1 mmol/L Final   Chloride 09/23/2022 99  98 - 111 mmol/L Final   CO2 09/23/2022 24  22 - 32 mmol/L Final   Glucose, Bld 09/23/2022 117 (H)  70 - 99 mg/dL Final   Glucose reference range applies only to samples taken after fasting for at least 8 hours.   BUN 09/23/2022 13  6 - 20 mg/dL Final   Creatinine, Ser 09/23/2022 0.87  0.61 - 1.24 mg/dL Final  Calcium 09/23/2022 9.3  8.9 - 10.3 mg/dL Final   Total Protein 40/98/1191 7.0  6.5 - 8.1 g/dL Final   Albumin 47/82/9562 4.1  3.5 - 5.0 g/dL Final   AST 13/08/6576 26  15 - 41 U/L Final   ALT 09/23/2022 36  0 - 44 U/L Final   Alkaline Phosphatase 09/23/2022 73  38 - 126 U/L Final   Total Bilirubin 09/23/2022 0.6  0.3 - 1.2 mg/dL Final   GFR, Estimated 09/23/2022 >60  >60 mL/min Final   Comment: (NOTE) Calculated using the CKD-EPI Creatinine Equation (2021)    Anion gap 09/23/2022 12  5 - 15 Final   Performed at [facility], [address], [place], [place] [address]   Hgb A1c MFr Bld 09/23/2022 5.9 (H)  4.8 - 5.6 % Final   Comment: (NOTE) Pre diabetes:          5.7%-6.4%  Diabetes:              >6.4%  Glycemic control for   <7.0% adults with diabetes    Mean Plasma Glucose 09/23/2022 122.63  mg/dL Final   Performed at [facility], [address] [place], [place] [address]   Alcohol, Ethyl (B) 09/23/2022 <10  <10 mg/dL Final   Comment: (NOTE) Lowest detectable limit for serum alcohol is 10 mg/dL.  For medical purposes only. Performed at [facility], [address] [place], [place] [address]    Cholesterol 09/23/2022 157  0 - 200 mg/dL Final   Triglycerides 01/06/7251 55  <150 mg/dL Final   HDL 66/44/0347 59  >40 mg/dL Final   Total CHOL/HDL Ratio 09/23/2022 2.7  RATIO Final   VLDL 09/23/2022 11  0 - 40 mg/dL Final   LDL  Cholesterol 09/23/2022 87  0 - 99 mg/dL Final   Comment:        Total Cholesterol/HDL:CHD Risk Coronary Heart Disease Risk Table                     Men   Women  1/2 Average Risk   3.4   3.3  Average Risk       5.0   4.4  2 X Average Risk   9.6   7.1  3 X Average Risk  23.4   11.0        Use the calculated Patient Ratio above and the CHD Risk Table to determine the patient's CHD Risk.        ATP III CLASSIFICATION (LDL):  <100     mg/dL   Optimal  425-956  mg/dL   Near or Above                    Optimal  130-159  mg/dL   Borderline  387-564  mg/dL   High  >332     mg/dL   Very High Performed at [facility], [address] [place], [place] [address]    TSH 09/23/2022 0.182 (L)  0.350 - 4.500 uIU/mL Final   Comment: Performed by a 3rd Generation assay with a functional sensitivity of <=0.01 uIU/mL. Performed at [facility], [address] [place], [place] [address]    POC Amphetamine UR 09/23/2022 Positive (A)  NONE DETECTED (Cut Off Level 1000 ng/mL) Final   POC Secobarbital (BAR) 09/23/2022 None Detected  NONE DETECTED (Cut Off Level 300 ng/mL) Final   POC Buprenorphine (BUP) 09/23/2022 None Detected  NONE  DETECTED (Cut Off Level 10 ng/mL) Final   POC Oxazepam (BZO) 09/23/2022 None Detected  NONE DETECTED (Cut Off Level 300 ng/mL) Final   POC Cocaine UR 09/23/2022 None Detected  NONE DETECTED (Cut Off Level 300 ng/mL) Final   POC Methamphetamine UR 09/23/2022 Positive (A)  NONE DETECTED (Cut Off Level 1000 ng/mL) Final   POC Morphine 09/23/2022 None Detected  NONE DETECTED (Cut Off Level 300 ng/mL) Final   POC Methadone UR 09/23/2022 None Detected  NONE DETECTED (Cut Off Level 300 ng/mL) Final   POC Oxycodone UR 09/23/2022 None Detected  NONE DETECTED (Cut Off Level 100 ng/mL) Final   POC Marijuana UR 09/23/2022 None Detected (A)  NONE DETECTED (Cut Off Level 50 ng/mL) Final    Blood Alcohol level:  Lab Results  Component Value Date   ETH <10 09/23/2022    ETH <5 01/14/2016    Metabolic Disorder Labs: Lab Results  Component Value Date   HGBA1C 5.9 (H) 09/23/2022   MPG 122.63 09/23/2022   MPG 111 01/18/2016   Lab Results  Component Value Date   PROLACTIN 19.0 (H) 01/18/2016   Lab Results  Component Value Date   CHOL 157 09/23/2022   TRIG 55 09/23/2022   HDL 59 09/23/2022   CHOLHDL 2.7 09/23/2022   VLDL 11 09/23/2022   LDLCALC 87 09/23/2022   LDLCALC 68 01/18/2016    Therapeutic Lab Levels: No results found for: "LITHIUM" No results found for: "VALPROATE" No results found for: "CBMZ"  Physical Findings   AIMS    Flowsheet Row [facility]  AIMS Total Score 0      AUDIT    Flowsheet Row [facility]  Alcohol Use Disorder Identification Test Final Score (AUDIT) 2      PHQ2-9    Flowsheet Row ED from 09/23/2022 in [facility]  PHQ-2 Total Score 3  PHQ-9 Total Score 18      Flowsheet Row ED from 09/23/2022 in [facility] Most recent reading at 09/23/2022  4:31 PM ED from 09/23/2022 in [facility] Most recent reading at 09/23/2022  1:54 PM ED from 08/27/2022 in [facility] Most recent reading at 08/27/2022  8:14 PM  C-SSRS RISK CATEGORY No Risk No Risk No Risk        Musculoskeletal  Strength & Muscle Tone: within normal limits Gait & Station: normal Patient leans: N/A  Psychiatric Specialty Exam  Presentation  General Appearance:  Appropriate for Environment  Eye Contact: Good  Speech: Clear and Coherent  Speech Volume: Normal  Handedness: Right   Mood and Affect  Mood: Euthymic  Affect: Appropriate   Thought Process  Thought Processes: Coherent  Descriptions of Associations:Intact  Orientation:Full (Time,  Place and Person)  Thought Content:Logical  Diagnosis of Schizophrenia or Schizoaffective disorder in past: No    Hallucinations:Hallucinations: None  Ideas of Reference:None  Suicidal Thoughts:Suicidal Thoughts: No  Homicidal Thoughts:Homicidal Thoughts: No   Sensorium  Memory: Immediate Good  Judgment: Good  Insight: Good   Executive Functions  Concentration: Good  Attention Span: Fair  Recall: Good  Fund of Knowledge: Good  Language: Good   Psychomotor Activity  Psychomotor Activity:No data recorded  Assets  Assets: Communication Skills; Physical Health; Housing; Social Support; Desire for Improvement   Sleep  Sleep: Sleep: Poor Number of Hours of Sleep:  3   Nutritional Assessment (For OBS and FBC admissions only) Has the patient had a weight loss or gain of 10 pounds or more in the last 3 months?: No Has the patient had a decrease in food intake/or appetite?: No Does the patient have dental problems?: No Does the patient have eating habits or behaviors that may be indicators of an eating disorder including binging or inducing vomiting?: No Has the patient recently lost weight without trying?: 0 Has the patient been eating poorly because of a decreased appetite?: 0 Malnutrition Screening Tool Score: 0    Physical Exam  Physical Exam Constitutional:      Appearance: Normal appearance.  HENT:     Head: Normocephalic and atraumatic.     Nose: Nose normal.     Mouth/Throat:     Mouth: Mucous membranes are moist.     Pharynx: Oropharynx is clear.  Eyes:     Extraocular Movements: Extraocular movements intact.     Conjunctiva/sclera: Conjunctivae normal.     Pupils: Pupils are equal, round, and reactive to light.  Cardiovascular:     Rate and Rhythm: Normal rate.  Pulmonary:     Effort: Pulmonary effort is normal.  Abdominal:     General: Abdomen is flat.     Palpations: Abdomen is soft.  Musculoskeletal:        General: Normal  range of motion.     Cervical back: Normal range of motion and neck supple.  Skin:    General: Skin is warm and dry.  Neurological:     General: No focal deficit present.     Mental Status: He is alert and oriented to person, place, and time.    Review of Systems  Constitutional: Negative.   HENT: Negative.    Eyes: Negative.   Respiratory: Negative.    Cardiovascular: Negative.   Gastrointestinal: Negative.   Genitourinary: Negative.   Musculoskeletal:  Positive for back pain.  Skin: Negative.   Neurological: Negative.   Psychiatric/Behavioral:  Positive for depression, hallucinations and substance abuse. Negative for suicidal ideas. The patient is nervous/anxious.        Feels skin crawling (tactile hallucination)   Blood pressure 128/69, pulse 67, temperature 98.4 F (36.9 C), temperature source Oral, resp. rate 18, SpO2 100%. There is no height or weight on file to calculate BMI.  Treatment Plan Summary:  #Methamphetamine use disorder UDS positive for amphetamine and methamphetamine -Bentyl 20mg  q6h prn for abdominal cramping -Imodium 2-4mg  prn for diarrhea -Robaxin 500mg  for muscle spasms -Naproxen 500mg  bid prn for pain -Zofran 4mg  q6h prn for nausea  #Substance-induced mood disorder Reports symptoms of depression, anxiety, AVH, and paranoid thoughts, but only during periods while using substances. -Atarax 25mg  q6h prn for agitation -Trazodone 50mg  prn at bedtime for sleep -Reassess mood and psychotic symptoms after detox, consider adding antidepressant tomorrow  #Tobacco use disorder  -Nicotine patch and gum provided  -Encouraged tobacco cessation  Dispo: plan discharge in 3-5 days to residential rehab vs. CDIOP -Residential rehabilitation favored due to chronic substance use and high risk of relapse -Arrange for outpatient talk therapy post-discharge  Daily contact with patient to assess and evaluate symptoms and progress in treatment and Medication  management  Hanley Ben, Medical Student 09/24/2022 11:35 AM  I personally was present and performed or re-performed the history and medical decision-making activities of this service and have verified that the service and findings are accurately documented in the student's note.  Kizzie Ide, MD, PGY-2

## 2023-06-08 ENCOUNTER — Emergency Department (HOSPITAL_BASED_OUTPATIENT_CLINIC_OR_DEPARTMENT_OTHER)
Admission: EM | Admit: 2023-06-08 | Discharge: 2023-06-08 | Disposition: A | Discharge status: Home / Self Care | Attending: Emergency Medicine | Admitting: Emergency Medicine

## 2023-06-08 ENCOUNTER — Other Ambulatory Visit: Payer: Self-pay

## 2023-06-08 DIAGNOSIS — R21 Rash and other nonspecific skin eruption: Secondary | ICD-10-CM | POA: Diagnosis present

## 2023-06-08 DIAGNOSIS — L259 Unspecified contact dermatitis, unspecified cause: Secondary | ICD-10-CM | POA: Insufficient documentation

## 2023-06-08 MED ORDER — PREDNISONE 10 MG (21) PO TBPK
ORAL_TABLET | Freq: Every day | ORAL | 0 refills | Status: AC
Start: 2023-06-08 — End: ?

## 2023-06-08 MED ORDER — HYDROXYZINE HCL 25 MG PO TABS
25.0000 mg | ORAL_TABLET | Freq: Four times a day (QID) | ORAL | 0 refills | Status: AC | PRN
Start: 2023-06-08 — End: ?

## 2023-06-08 NOTE — Discharge Instructions (Signed)
 Prednisone as prescribed and complete the full course. Hydroxyzine  as needed as prescribed for itching.

## 2023-06-08 NOTE — ED Provider Notes (Signed)
 [place] EMERGENCY DEPARTMENT AT [facility] Provider Note   CSN: [id number] Arrival date & time: 06/08/23  1514     History  Chief Complaint  Patient presents with   Poison Ivy    GIANCARLO ASKREN is a 38 y.o. male.  38 year old male presents with complaint of rash to bilateral forearms, now with left eyelid swelling. 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 History of significant eyelid swelling, concerned for same, requesting prednisone. No other complaints or concerns.        Home Medications Prior to Admission medications   Medication Sig Start Date End Date Taking? Authorizing Provider  hydrOXYzine  (ATARAX ) 25 MG tablet Take 1 tablet (25 mg total) by mouth every 6 (six) hours as needed for itching. 06/08/23  Yes Darlis Eisenmenger, PA-C  predniSONE (STERAPRED UNI-PAK 21 TAB) 10 MG (21) TBPK tablet Take by mouth daily. Take 6 tabs by mouth daily  for 2 days, then 5 tabs for 2 days, then 4 tabs for 2 days, then 3 tabs for 2 days, 2 tabs for 2 days, then 1 tab by mouth daily for 2 days 06/08/23  Yes Darlis Eisenmenger, PA-C  Multiple Vitamins-Minerals (ADULT GUMMY PO) Take 2 tablets by mouth daily.    [provider]  naproxen  sodium (ALEVE ) 220 MG tablet Take 220 mg by mouth 2 (two) times daily as needed (For body aches and fever).    [provider]  Zinc Citrate (ZINC GUMMY PO) Take 2 tablets by mouth daily.    [provider]      Allergies    Patient has no known allergies.    Review of Systems   Review of Systems Negative except as per HPI Physical Exam Updated Vital Signs BP 122/65 (BP Location: Right Arm)   Pulse 77   Temp 98.8 F (37.1 C)   Resp 16   SpO2 98%  Physical Exam Vitals and nursing note reviewed.  Constitutional:      General: He is not in acute distress.    Appearance: He is well-developed. He is not diaphoretic.   HENT:     Head: Normocephalic and atraumatic.  Pulmonary:     Effort: Pulmonary effort is normal.  Skin:    General: Skin is warm and dry.     Findings: Rash present.     Comments: Rash to bilateral forearms, left upper arm, vesicles, coalescing, mildly erythematous. Very slight swelling to left upper eye lid.   Neurological:     Mental Status: He is alert and oriented to person, place, and time.  Psychiatric:        Behavior: Behavior normal.     ED Results / Procedures / Treatments   Labs (all labs ordered are listed, but only abnormal results are displayed) Labs Reviewed - No data to display  EKG None  Radiology No results found.  Procedures Procedures    Medications Ordered in ED Medications - No data to display  ED Course/ Medical Decision Making/ A&P                                 Medical Decision Making Risk Prescription drug management.   38 year old male presents with concern for contact dermatitis, works in Building control surveyor.  Patient used his typical body wash to remove plant oils after  work yesterday however developed rash to bilateral forearms and is now developing swelling of his left upper eyelid.  He states in the past his eyelid has swollen shut, requesting prednisone.  Discussed expected course of rash with patient, will provide course of prednisone taper as well as hydroxyzine  for itching.  Return for worsening or concerning symptoms.        Final Clinical Impression(s) / ED Diagnoses Final diagnoses:  Contact dermatitis, unspecified contact dermatitis type, unspecified trigger    Rx / DC Orders ED Discharge Orders          Ordered    predniSONE (STERAPRED UNI-PAK 21 TAB) 10 MG (21) TBPK tablet  Daily        06/08/23 1548    hydrOXYzine  (ATARAX ) 25 MG tablet  Every 6 hours PRN        06/08/23 1548              Darlis Eisenmenger, PA-C 06/08/23 1557    Ninetta Basket, MD 06/10/23 [phone]

## 2023-06-08 NOTE — ED Triage Notes (Signed)
 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 Concerned for spreading to left orbit. Has tried topical wash solution and anti itch spray.

## 2023-07-09 ENCOUNTER — Encounter (HOSPITAL_BASED_OUTPATIENT_CLINIC_OR_DEPARTMENT_OTHER): Payer: Self-pay | Admitting: Emergency Medicine

## 2023-07-09 ENCOUNTER — Emergency Department (HOSPITAL_BASED_OUTPATIENT_CLINIC_OR_DEPARTMENT_OTHER)
Admission: EM | Admit: 2023-07-09 | Discharge: 2023-07-09 | Disposition: A | Discharge status: Home / Self Care | Attending: Emergency Medicine | Admitting: Emergency Medicine

## 2023-07-09 ENCOUNTER — Other Ambulatory Visit: Payer: Self-pay

## 2023-07-09 DIAGNOSIS — R21 Rash and other nonspecific skin eruption: Secondary | ICD-10-CM | POA: Diagnosis present

## 2023-07-09 DIAGNOSIS — L739 Follicular disorder, unspecified: Secondary | ICD-10-CM | POA: Insufficient documentation

## 2023-07-09 MED ORDER — DOXYCYCLINE HYCLATE 100 MG PO CAPS: 100.0000 mg | ORAL_CAPSULE | Freq: Two times a day (BID) | ORAL | 0 refills | Status: AC
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
## 2023-07-09 NOTE — ED Triage Notes (Signed)
 Poison ivy on both arms for a week, very slow to heal. Has had to have prednisone  in the past .  Earlier today his right chest started with hive looking rash and spread within the hour to cover whole right chest

## 2023-08-08 ENCOUNTER — Other Ambulatory Visit: Payer: Self-pay

## 2023-08-08 ENCOUNTER — Emergency Department (HOSPITAL_BASED_OUTPATIENT_CLINIC_OR_DEPARTMENT_OTHER)
Admission: EM | Admit: 2023-08-08 | Discharge: 2023-08-08 | Disposition: A | Discharge status: Home / Self Care | Attending: Emergency Medicine | Admitting: Emergency Medicine

## 2023-08-08 DIAGNOSIS — L03116 Cellulitis of left lower limb: Secondary | ICD-10-CM | POA: Insufficient documentation

## 2023-08-08 DIAGNOSIS — L02416 Cutaneous abscess of left lower limb: Secondary | ICD-10-CM | POA: Insufficient documentation

## 2023-08-08 DIAGNOSIS — L0291 Cutaneous abscess, unspecified: Secondary | ICD-10-CM

## 2023-08-08 MED ORDER — SULFAMETHOXAZOLE-TRIMETHOPRIM 800-160 MG PO TABS
1.0000 | ORAL_TABLET | Freq: Once | ORAL | Status: AC
  Administered 2023-08-08: 1 via ORAL
  Filled 2023-08-08: qty 1

## 2023-08-08 MED ORDER — SULFAMETHOXAZOLE-TRIMETHOPRIM 800-160 MG PO TABS
1.0000 | ORAL_TABLET | Freq: Two times a day (BID) | ORAL | 0 refills | Status: AC
Start: 2023-08-08 — End: 2023-08-15

## 2023-08-08 MED ORDER — CEPHALEXIN 250 MG PO CAPS
500.0000 mg | ORAL_CAPSULE | Freq: Once | ORAL | Status: AC
  Administered 2023-08-08: 500 mg via ORAL
  Filled 2023-08-08: qty 2

## 2023-08-08 MED ORDER — CEFPODOXIME PROXETIL 200 MG PO TABS: 200.0000 mg | ORAL_TABLET | Freq: Two times a day (BID) | ORAL | 0 refills | Status: AC

## 2023-08-08 NOTE — Discharge Instructions (Addendum)
 I would recommend warm compress over abscess.  I would also recommend taking antibiotic as prescribed.  If your symptoms are worsening or have not started to improve in 2 days I would return to emergency room for recheck of symptoms.

## 2023-08-08 NOTE — ED Provider Notes (Signed)
 [place] EMERGENCY DEPARTMENT AT [facility] Provider Note   CSN: [id number] Arrival date & time: 08/08/23  1535     Patient presents with: Recurrent Skin Infections   Jay Moreno is a 38 y.o. male patient with past medical history of ADHD, substance abuse presents to emergency room with complaint of a boil.  Patient reports that right at his left hip he started experiencing an abscess that started 4 days ago.  Over the past 2 days has had some surrounding redness from this area as well.  He denies any fever.  Area has started spontaneously draining on its own.   HPI     Prior to Admission medications   Medication Sig Start Date End Date Taking? Authorizing Provider  cefpodoxime  (VANTIN ) 200 MG tablet Take 1 tablet (200 mg total) by mouth 2 (two) times daily for 7 days. 08/08/23 08/15/23 Yes Rajveer Handler N, PA-C  sulfamethoxazole -trimethoprim  (BACTRIM  DS) 800-160 MG tablet Take 1 tablet by mouth 2 (two) times daily for 7 days. 08/08/23 08/15/23 Yes Labrina Lines N, PA-C  doxycycline  (VIBRAMYCIN ) 100 MG capsule Take 1 capsule (100 mg total) by mouth 2 (two) times daily. 07/09/23   Patsey Lot, MD  hydrOXYzine  (ATARAX ) 25 MG tablet Take 1 tablet (25 mg total) by mouth every 6 (six) hours as needed for itching. 06/08/23   Beverley Leita LABOR, PA-C  Multiple Vitamins-Minerals (ADULT GUMMY PO) Take 2 tablets by mouth daily.    [provider]  naproxen  sodium (ALEVE ) 220 MG tablet Take 220 mg by mouth 2 (two) times daily as needed (For body aches and fever).    [provider]  predniSONE  (STERAPRED UNI-PAK 21 TAB) 10 MG (21) TBPK tablet Take by mouth daily. Take 6 tabs by mouth daily  for 2 days, then 5 tabs for 2 days, then 4 tabs for 2 days, then 3 tabs for 2 days, 2 tabs for 2 days, then 1 tab by mouth daily for 2 days 06/08/23   Beverley Leita A, PA-C  Zinc Citrate (ZINC GUMMY PO) Take 2 tablets by mouth daily.    [provider]    Allergies: Patient  has no known allergies.    Review of Systems  Skin:  Positive for wound.    Updated Vital Signs BP (!) 161/78 (BP Location: Right Arm)   Pulse 79   Temp 98.5 F (36.9 C)   Resp 16   SpO2 95%   Physical Exam Vitals and nursing note reviewed.  Constitutional:      General: He is not in acute distress.    Appearance: He is not toxic-appearing.  HENT:     Head: Normocephalic and atraumatic.  Eyes:     General: No scleral icterus.    Conjunctiva/sclera: Conjunctivae normal.  Cardiovascular:     Rate and Rhythm: Normal rate and regular rhythm.     Pulses: Normal pulses.     Heart sounds: Normal heart sounds.  Pulmonary:     Effort: Pulmonary effort is normal. No respiratory distress.     Breath sounds: Normal breath sounds.  Abdominal:     General: Abdomen is flat. Bowel sounds are normal.     Palpations: Abdomen is soft.     Tenderness: There is no abdominal tenderness.  Skin:    General: Skin is warm and dry.     Findings: No lesion.     Comments: 2x2cm abscess with active purulent drainage surrounding medial erythema  Neurological:     General:  No focal deficit present.     Mental Status: He is alert and oriented to person, place, and time. Mental status is at baseline.     (all labs ordered are listed, but only abnormal results are displayed) Labs Reviewed - No data to display  EKG: None  Radiology: No results found.   Procedures   Medications Ordered in the ED  cephALEXin  (KEFLEX ) capsule 500 mg (has no administration in time range)  sulfamethoxazole -trimethoprim  (BACTRIM  DS) 800-160 MG per tablet 1 tablet (has no administration in time range)                                    Medical Decision Making Risk Prescription drug management.   This patient presents to the ED for concern of abscess, this involves an extensive number of treatment options, and is a complaint that carries with it a high risk of complications and morbidity.  The differential  diagnosis includes abscess, cellulitis, deep space infection   Problem List / ED Course / Critical interventions / Medication management  Patient reporting with abscess to his left hip that has started spontaneously draining on its own.  This started 4 days ago, over the past 2 days he has had some surrounding cellulitis in this area.  He denies fever.  He is hemodynamically stable and well-appearing.  He was recently diagnosed with folliculitis about a month ago, but notes this is a different area.  On my exam he has small approximately 2 cm abscess that is spontaneously draining with no area of fluctuance.  It does have surrounding erythema suggesting cellulitis.  With point-of-care ultrasound did not visualize any fluid collection suggesting incision and drainage is necessary in ED today.  Discussed this with patient and expresses understanding.  Will trial oral antibiotics as I feel that is most appropriate treatment at this time.  Given strict return precautions.  He will follow-up with primary care or return to ED.     Final diagnoses:  Abscess    ED Discharge Orders          Ordered    sulfamethoxazole -trimethoprim  (BACTRIM  DS) 800-160 MG tablet  2 times daily        08/08/23 1616    cefpodoxime  (VANTIN ) 200 MG tablet  2 times daily        08/08/23 1616               Abdulhadi Stopa, [place] SAILOR, PA-C 08/08/23 1624    Dean Clarity, MD 08/08/23 [phone]

## 2023-08-08 NOTE — ED Triage Notes (Signed)
 C/o of boil on left hip x 4 days. Denies fevers.
